# Patient Record
Sex: Female | Born: 1965 | Race: White | Hispanic: Yes | Smoking: Never smoker
Health system: Western US, Academic
[De-identification: ages and names within clinical notes are randomized; demographics above are authoritative.]

## PROBLEM LIST (undated history)

## (undated) DIAGNOSIS — D219 Benign neoplasm of connective and other soft tissue, unspecified: Secondary | ICD-10-CM

## (undated) DIAGNOSIS — I10 Essential (primary) hypertension: Secondary | ICD-10-CM

## (undated) DIAGNOSIS — K922 Gastrointestinal hemorrhage, unspecified: Secondary | ICD-10-CM

## (undated) DIAGNOSIS — D649 Anemia, unspecified: Secondary | ICD-10-CM

## (undated) DIAGNOSIS — K2970 Gastritis, unspecified, without bleeding: Secondary | ICD-10-CM

## (undated) DIAGNOSIS — N289 Disorder of kidney and ureter, unspecified: Secondary | ICD-10-CM

## (undated) DIAGNOSIS — I2510 Atherosclerotic heart disease of native coronary artery without angina pectoris: Secondary | ICD-10-CM

## (undated) DIAGNOSIS — E039 Hypothyroidism, unspecified: Secondary | ICD-10-CM

## (undated) HISTORY — DX: Essential (primary) hypertension: I10

## (undated) HISTORY — PX: ENDOMETRIAL ABLATION: SHX621

## (undated) HISTORY — PX: TUBAL LIGATION: SHX77

## (undated) HISTORY — DX: Hypothyroidism, unspecified: E03.9

## (undated) HISTORY — DX: Gastrointestinal hemorrhage, unspecified: K92.2

## (undated) HISTORY — DX: Gastritis, unspecified, without bleeding: K29.70

## (undated) HISTORY — DX: Disorder of kidney and ureter, unspecified: N28.9

## (undated) HISTORY — DX: Benign neoplasm of connective and other soft tissue, unspecified: D21.9

## (undated) HISTORY — DX: Atherosclerotic heart disease of native coronary artery without angina pectoris: I25.10

## (undated) HISTORY — DX: Anemia, unspecified: D64.9

## (undated) MED ORDER — SODIUM CHLORIDE 0.9 % IV SOLN
500.0000 mL | INTRAVENOUS | Status: AC
Start: 2020-04-19 — End: ?

## (undated) MED ORDER — CEFAZOLIN SODIUM-DEXTROSE 2-4 GM/100ML-% IV SOLN
2000.0000 mg | Freq: Once | INTRAVENOUS | Status: AC
Start: 2020-04-25 — End: 2020-04-25

## (undated) MED ORDER — ENOXAPARIN SODIUM 100 MG/ML SC SOLN
40.0000 mg | Freq: Once | SUBCUTANEOUS | Status: AC
Start: 2020-03-27 — End: 2020-03-27

## (undated) MED ORDER — ENOXAPARIN SODIUM 40 MG/0.4ML SC SOLN
SUBCUTANEOUS | Status: AC
Start: 2020-05-14 — End: ?

## (undated) MED ORDER — ENOXAPARIN SODIUM 40 MG/0.4ML SC SOLN
SUBCUTANEOUS | Status: AC
Start: 2020-05-15 — End: ?

## (undated) MED ORDER — ACETAMINOPHEN 500 MG OR TABS
1000.0000 mg | ORAL_TABLET | Freq: Once | ORAL | Status: AC
Start: 2020-04-19 — End: 2020-04-19

---

## 2019-02-04 ENCOUNTER — Telehealth: Payer: Self-pay

## 2019-02-04 NOTE — Telephone Encounter (Signed)
Spoke to patient about IEHP and [facility] No Contract therefore we need an LOA in Sharepoint to continue visit. I called pt again and left her a voice message with description of LOA and directing [place] requested to continue appointment as Self Pay, but will follow up with [facility] for LOA Letterfor future appointments.

## 2019-02-07 ENCOUNTER — Encounter: Payer: Self-pay | Admitting: Ob/Gyn

## 2019-02-07 ENCOUNTER — Ambulatory Visit: Payer: Self-pay | Attending: Ob/Gyn | Admitting: Ob/Gyn

## 2019-02-07 VITALS — BP 136/73 | HR 81 | Temp 97.9°F | Resp 18 | Ht 62.99 in | Wt 273.7 lb

## 2019-02-07 DIAGNOSIS — N898 Other specified noninflammatory disorders of vagina: Secondary | ICD-10-CM | POA: Insufficient documentation

## 2019-02-07 DIAGNOSIS — Z6841 Body Mass Index (BMI) 40.0 and over, adult: Secondary | ICD-10-CM | POA: Insufficient documentation

## 2019-02-07 DIAGNOSIS — R300 Dysuria: Secondary | ICD-10-CM | POA: Insufficient documentation

## 2019-02-07 DIAGNOSIS — N939 Abnormal uterine and vaginal bleeding, unspecified: Secondary | ICD-10-CM | POA: Insufficient documentation

## 2019-02-07 LAB — URINALYSIS 8, POINT OF CARE TESTING
Bld, UA POCT: NEGATIVE
Glucose, UA POCT: NEGATIVE mg/dL
Ketone, UA POCT: NEGATIVE mg/dL
Nitrite, UA POCT: NEGATIVE
Protein, UA POCT: NEGATIVE mg/dL
Specific Gravity, UA (POCT): 1.025 (ref 1.003–1.030)
pH, UA POCT: 6.5 (ref 5.0–8.0)

## 2019-02-07 LAB — TRICHOMONAS EXAM: Direct Exam: NEGATIVE

## 2019-02-07 LAB — HEMOGLOBIN, HEMOCUE POINT OF CARE TESTING: Hem, Hemocue POC: 11.9 G/DL (ref 11.5–15.0)

## 2019-02-07 MED ORDER — MEDROXYPROGESTERONE ACETATE 10 MG OR TABS
10.0000 mg | ORAL_TABLET | Freq: Every day | ORAL | 11 refills | Status: DC
Start: 2019-02-07 — End: 2019-04-14

## 2019-02-07 MED ORDER — DOXYCYCLINE MONOHYDRATE 100 MG OR CAPS
100.0000 mg | ORAL_CAPSULE | Freq: Two times a day (BID) | ORAL | 0 refills | Status: AC
Start: 2019-02-07 — End: 2019-02-21

## 2019-02-07 NOTE — Progress Notes (Signed)
53 year old [id number] with PMH of bmi of 13, HTN, fibroids, cardiovascular disease, hiatal hernia, c-section presenting for AUB heavy menstrual bleeding, menses always heavy and long. Then 11 years ago started going 8-10 days.  S/p hysteroscopy and endometrial ablation x2 (we do not know if she ever had an EMBx) and last ablation probably May and still heavy bleeding. Reports dizziness.   BP 136/73 (BP Location: Left arm, BP Patient Position: Sitting, BP cuff size: Regular)   Pulse 81   Temp 97.9 F (36.6 C) (Temporal)   Resp 18   Ht 5' 2.99" (1.6 m)   Wt 124.2 kg (273 lb 11.2 oz)   BMI 48.50 kg/m   Pap uptodate  Get all records from [facility]- especially path  Pain on exam- course of doxycyclin 100 mg BID  Hemoque 11.9  Rec TVUS   Start trial provera 10 daily until menses and increase to BID during menses will give enough refills for 11 months  Recommend f/u with PCP for possible surgical clearance   Given cardiac history will recommend LNG IUD initially     Attending Attestation  I saw and examined the patient and discussed the case with the resident/fellow. I agree with the final findings and plan as documented in the record. We formulated the assessment and plan together. Any additions or revisions are included in the record.     Tiburcio Bash MD    02/07/19  3:51 PM

## 2019-02-07 NOTE — Progress Notes (Signed)
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    The patient reports a long-standing history of heavy, long periods. From menarche until 2009, she had regular periods with 8 days of bleeding, 6 of which were heavy. In 2009, her periods became longer, now lasting 10 days with heavy bleeding for 8 of those days. She reports using 3 superpads per hour during her heavy days. She endorses occasional dizziness and headaches, which she associates with her heart disease. She denies chest pain. She takes PO iron TID. She denies constipation.    Per records received, patient underwent an ablation with hysteroscopy in Nov/Dec 2019. Per patient her bleeding was worse after that second ablation. She is unsure of whether she has had an endometrial biopsy.     She has never been on hormonal treatment for her AUB. She has never had a discussion about LNG-IUD.    In terms of her h/o fibroids, she reports that she underwent a preterm C-section for her last pregnancy because of her fibroid. She reports that it has been a long time since she had a TVUS.    She has chronic abdominal pain due to her hiatal hernia. She complains of vaginal/vulvar burning and irritation. She denies dysuria, vaginal discharge, or pelvic pain.    She was referred from her Gyn provider--Michelle Oropeza PA--to [facility] for surgical management. However, she was unable to be seen at [facility], so she was sent to [place].    Obstetrical History:  OB History   Gravida Para Term Preterm AB Living   5 4 3 1 1 4    SAB TAB Ectopic Multiple Live Births   1       4      # Outcome Date GA Lbr Len/2nd Weight Sex Delivery Anes PTL Lv    5 Preterm  [redacted]w[redacted]d    CS-LTranv   LIV   4 Term  [redacted]w[redacted]d       LIV   3 Term  [redacted]w[redacted]d       LIV   2 Term  [redacted]w[redacted]d       LIV   1 SAB               Obstetric Comments   G1: SAB   G2-G4: NSVD   G5: preterm C-section due to fibroids        Gynecologic History:  Menarche 12  Periods occur monthly, but have always been heavy (3 pads/hr) and long (lasting 8 days)  Reports that periods have been lasting longer (8-10days) x11 years  Hysteroscopy and ablation in 2016, 2020  Bleeding is worse after second ablation  Last pap smear 2019 - normal  Denies h/o STI or STD    Past Medical History:  Past Medical History:   Diagnosis Date   . Anemia    . Congenital kidney disease    . Coronary artery disease    . Fibroids    . Gastritis    . GI bleed    . Hypertension    . Hypothyroidism         Past Surgical History:  Past Surgical History:   Procedure  Laterality Date   . CESAREAN SECTION, LOW TRANSVERSE     . ENDOMETRIAL ABLATION      x2   . TUBAL LIGATION      at time of C-section       Family History:   Family History   Problem Relation Name Age of Onset   . Hypertension Mother     . Osteoporosis Mother     . Stroke Mother     . No Known Problems Sister     . No Known Problems Brother         Social History:   Lives in [place], in an apartment, lives with 2 daughters and boyfriend. Feels safe.  Denies EtOH, tobacco, illicit drug use  Denies h/o assault or abuse    Medications:   Amlodipine-benazepril  Metoprolol  Levothyroxine  Iron  Tylenol for pain    Allergies:   No Known Allergies    ROS:   Per HPI. 14 point ROS otherwise negative.    Physical Exam:   BP 136/73 (BP Location: Left arm, BP Patient Position: Sitting, BP cuff size: Regular)   Pulse 81   Temp 97.9 F (36.6 C) (Temporal)   Resp 18   Ht 5' 2.99" (1.6 m)   Wt 124.2 kg (273 lb 11.2 oz)   BMI 48.50 kg/m     General: no acute distress  HEENT: NC/AT  Lungs: unlabored breathing  Abdomen: soft, obese, non-gravid, TTP in epigastric region, no rebound or  guarding.  Vaginal Exam:  Normal appearing external genitalia with redundant tissue.  Unable to appreciate uterus or adnexal masses 2/2 body habitus.  No vaginal wall lesions or cervical lesions. Normal appearing physiologic discharge + cervical motion tenderness, + left adnexal tenderness, - right adnexal tenderness  Extremities: no clubbing, cyanosis, edema    Labs:  Hemocue 11.9      Assessment/Plan:  Brandi Hart is a 53 year old [phone] with a complex medical history including CAD, fibroids, HTN, morbid obesity, s/p 2 ablations for AUB presenting for management of her AUB.    # AUB  - records requested today: Pap, prior pathology (EMB or D&C), ultrasounds  - patient with symptoms of anemia, but I am reassured by Hemocue today of 11.9  - patient desires hysterectomy but we discussed today that she is higher risk and that conservative management with an LNG-IUD may be a safer option for her  - TVUS ordered today to assess for structural lesions as well as for uterine size   - rx provera 10 daily until menses and increase to BID during menses   - patient encouraged to make appointment with her PCP regarding medical optimization for surgery    # concern for pelvic inflammatory disease  - + cervical motion tenderness, + left adnexal tenderness  - swabs sent for GC/CT  - rx doxycycline 100mg  BID x14d sent to pharmacy    # vaginal/vulvar irritation and burning  - UDip unremarkable  - NEFG with normal physiologic discharge  - swabs for GC/CT and vaginitis sent, will f/u results    Insurance coverage will need to be coordinated with [place]. Patient is instructed to call Rosa once her ultrasound has been completed. We can then pursue insurance approval (which will be good for 1 year), and schedule patient for return to clinic. At that time, her records including ultrasound, can be reviewed and LNG-IUD can be discussed further.    The patient understands the above assessment and plan  as outlined above. All questions were  answered at bedside with the patient.  Patient discussed with Dr. Jerry Caras (A).     Briant Cedar MD, MPH  Obstetrics & Gynecology, PGY-2    02/07/19, 2:50 PM

## 2019-02-08 ENCOUNTER — Telehealth: Payer: Self-pay

## 2019-02-08 NOTE — Telephone Encounter (Signed)
Re faxed medical records to [phone]) Louie Boston, LVN

## 2019-02-08 NOTE — Telephone Encounter (Signed)
Faxed auth to obtain medical records from [place] [phone]) Louie Boston, [place]

## 2019-02-08 NOTE — Progress Notes (Signed)
89 year [id number] with PMH of bmi of 48, HTN, fibroids, cardiovascular disease, hiatal hernia, c-section presenting for AUB heavy menstrual bleeding, menses always heavy and long. Then 11 years ago started going 8-10 days.  S/p hysteroscopy and endometrial ablation x2 (we do not know if she ever had an EMBx) and last ablation probably May and still heavy bleeding. Reports dizziness.   BP 136/73 (BP Location: Left arm, BP Patient Position: Sitting, BP cuff size: Regular)   Pulse 81   Temp 97.9 F (36.6 C) (Temporal)   Resp 18   Ht 5' 2.99" (1.6 m)   Wt 124.2 kg (273 lb 11.2 oz)   BMI 48.50 kg/m         Pap uptodate  Get all records from [facility]- especially path  Pain on exam- course of doxycyclin 100 mg BID  Hemoque 11.9  Rec TVUS   Start trial provera 10 daily until menses and increase to BID during menses will give enough refills for 11 months  Recommend f/u with PCP for possible surgical clearance   Given cardiac history will recommend LNG IUD initially     Attending Attestation  I saw and examined the patient and discussed the case with the resident/fellow. I agree with the final findings and plan as documented in the record. We formulated the assessment and plan together. Any additions or revisions are included in the record.     Tiburcio Bash MD    02/07/19  3:51 PM

## 2019-02-10 ENCOUNTER — Telehealth: Payer: Self-pay | Admitting: Ob/Gyn

## 2019-02-10 LAB — GENITAL CULTURE

## 2019-02-10 MED ORDER — MICONAZOLE NITRATE APPLICATOR 100 & 2 MG-% (9GM) VA KIT
1.0000 | PACK | Freq: Every day | VAGINAL | 0 refills | Status: AC
Start: 2019-02-10 — End: 2019-02-17

## 2019-02-10 NOTE — Telephone Encounter (Signed)
Phoned patient to inform of yeast infection, monistat sent to pharmacy, patient amenable to treatment plan     Brandi Hart A. Arvin Collard, MD  OBGYN PGY-3

## 2019-02-14 LAB — C. TRACHOMATIS + N. GONORRHOEAE
Nucleic Acid Ct: NEGATIVE
Nucleic Acid Ng: NEGATIVE

## 2019-02-15 ENCOUNTER — Encounter: Payer: Self-pay | Admitting: Ob/Gyn

## 2019-02-24 ENCOUNTER — Telehealth: Payer: Self-pay

## 2019-02-24 NOTE — Telephone Encounter (Signed)
Medical records received from [facility] on 02/09/2019, a total of 9 pgs. Records put in chiefs box ready for review.  Brandi Hart

## 2019-02-26 ENCOUNTER — Ambulatory Visit
Admission: RE | Admit: 2019-02-26 | Discharge: 2019-02-26 | Disposition: A | Discharge status: Home / Self Care | Payer: Medicaid Other | Attending: Ob/Gyn | Admitting: Ob/Gyn

## 2019-02-26 DIAGNOSIS — N939 Abnormal uterine and vaginal bleeding, unspecified: Secondary | ICD-10-CM | POA: Insufficient documentation

## 2019-02-26 DIAGNOSIS — N858 Other specified noninflammatory disorders of uterus: Secondary | ICD-10-CM | POA: Insufficient documentation

## 2019-02-28 ENCOUNTER — Encounter: Payer: Self-pay | Admitting: Student in an Organized Health Care Education/Training Program

## 2019-02-28 NOTE — Progress Notes (Signed)
2019 Pap results and 2018 H. Pylor results received:      Pap w/ HPV mRNA  Collected 01/26/2018  Results: Pap: Negative (NILM)  HPV: Negative    H. Pylori: Upper GI Biopsy, Stomach, Antrum  Collected: 09/10/2016  Diagnosis: Gastric mucosa with no diagnostic abnormalities. Negative for H. Pylori.      Medical Student  Varney Biles, MS4

## 2019-02-28 NOTE — Progress Notes (Signed)
Message sent to [facility] to try to get insurance for this patient for further tx of AUB

## 2019-03-07 ENCOUNTER — Encounter: Payer: Self-pay | Admitting: Student in an Organized Health Care Education/Training Program

## 2019-03-07 DIAGNOSIS — N939 Abnormal uterine and vaginal bleeding, unspecified: Secondary | ICD-10-CM

## 2019-03-07 NOTE — Progress Notes (Signed)
Per [place], patient has IEHP eligibility and she is unable to see [place] in our clinic. [facility] [place]. Patient already aware.    Bradd Canary, MD  [facility], PGY-3    For questions or concerns, please call the [facility] Resident 1 on [facility]

## 2019-03-21 ENCOUNTER — Telehealth: Payer: Self-pay | Admitting: Ob/Gyn

## 2019-03-21 NOTE — Telephone Encounter (Signed)
Message routed to Rosa M. [facility]

## 2019-03-21 NOTE — Telephone Encounter (Signed)
Patient states she is ready to move forward with scheduling her surgery, states she has been waiting to schedule it since before transfrering care to [facility]. Please call pt to further assist.

## 2019-03-29 NOTE — Telephone Encounter (Signed)
Will follow up with patient and advise LOA is still Pending As of Now. I will follow up with Contracting coordinator for [place] but for now unable to continue at [facility] until further notice.    Thank you

## 2019-04-01 ENCOUNTER — Telehealth: Payer: Self-pay | Admitting: Ob/Gyn

## 2019-04-01 NOTE — Telephone Encounter (Signed)
Clarified to [name] coordinator As Of Now IEHP LOA request is still Pending Approval, and Cannot schedule without an LOA until further notice at [facility]. Confirmed conversation and Will Continue appt as [facility].  Thank you

## 2019-04-01 NOTE — Telephone Encounter (Signed)
Spoke to Brass Castle at [facility] who states there is an authorization already approved for Obgyn she is going to fax it to [phone]. Roddie Mc that appointment on 04/14/19 with Dr Posey Pronto is the soonest available at this time.

## 2019-04-01 NOTE — Telephone Encounter (Signed)
Brandi Hart with Dr. Marquis Lunch office is requesting an urgent appt for ER follow up. pls see referral#[id number]-this is valid for use at [facility] office, but not for [facility] without LOA.

## 2019-04-14 ENCOUNTER — Encounter: Payer: Self-pay | Admitting: Ob/Gyn

## 2019-04-14 ENCOUNTER — Ambulatory Visit (INDEPENDENT_AMBULATORY_CARE_PROVIDER_SITE_OTHER): Payer: Medicaid Other | Admitting: Ob/Gyn

## 2019-04-14 ENCOUNTER — Telehealth: Payer: Self-pay | Admitting: Ob/Gyn

## 2019-04-14 VITALS — BP 116/72 | HR 69 | Ht 63.0 in | Wt 272.3 lb

## 2019-04-14 DIAGNOSIS — N939 Abnormal uterine and vaginal bleeding, unspecified: Secondary | ICD-10-CM

## 2019-04-14 MED ORDER — VITAMIN D3 25 MCG (1000 UT) PO CAPS
1.00 | ORAL_CAPSULE | Freq: Every day | ORAL | Status: AC
Start: 2019-02-23 — End: ?

## 2019-04-14 MED ORDER — FAMOTIDINE 20 MG OR TABS
ORAL_TABLET | ORAL | Status: DC
Start: 2019-02-10 — End: 2020-04-26

## 2019-04-14 MED ORDER — METOPROLOL TARTRATE 25 MG OR TABS
25.00 mg | ORAL_TABLET | Freq: Two times a day (BID) | ORAL | Status: AC
Start: 2019-02-04 — End: ?

## 2019-04-14 MED ORDER — ALBUTEROL SULFATE 108 (90 BASE) MCG/ACT IN AERS
INHALATION_SPRAY | RESPIRATORY_TRACT | Status: AC
Start: 2019-03-23 — End: ?

## 2019-04-14 MED ORDER — AMLODIPINE-BENAZEPRIL 10-40 MG OR CAPS
ORAL_CAPSULE | ORAL | Status: AC
Start: 2019-02-20 — End: ?

## 2019-04-14 MED ORDER — FLOVENT HFA 110 MCG/ACT IN AERO
INHALATION_SPRAY | RESPIRATORY_TRACT | Status: AC
Start: 2019-03-15 — End: ?

## 2019-04-14 MED ORDER — FUROSEMIDE 40 MG OR TABS
ORAL_TABLET | ORAL | Status: AC
Start: 2019-02-04 — End: ?

## 2019-04-14 MED ORDER — PANTOPRAZOLE SODIUM 40 MG OR TBEC
DELAYED_RELEASE_TABLET | ORAL | Status: AC
Start: 2019-03-18 — End: ?

## 2019-04-14 MED ORDER — ACETAMINOPHEN-CODEINE #3 300-30 MG OR TABS
ORAL_TABLET | ORAL | Status: DC
Start: 2019-03-28 — End: 2020-04-26

## 2019-04-14 MED ORDER — MEDROXYPROGESTERONE ACETATE 150 MG/ML IM SUSY
150.0000 mg | PREFILLED_SYRINGE | Freq: Once | INTRAMUSCULAR | Status: DC
Start: 2019-04-14 — End: 2019-04-14

## 2019-04-14 NOTE — H&P (Signed)
New Gynecologic Patient: Brandi Hart    Assessment/Plan  53 year old [id number] with AUB-heavy likely due to leiomyoma presents requesting surgical intervention. Patient also desires pain management for pelvic pain, though declines exam. We reviewed the findings from her previous ultrasound including the size of her uterus (11cm), the 5cm fibroid at the fundus, and 3cm fibroid in the uterine body. We discussed that definitive surgical intervention would require a hysterectomy and a minimally invasive approach would be the safest option given her medical history and obesity. She would need to be able to tolerate Trendelenberg position and we would need medical and cardiac clearance for surgery. She reports having clearance but no records available. She was asked to sign a records release and refused. She was counseled on the risks of surgery including bleeding, infection, injury to other organs, DVT/PE, and death. She was advised of the need for a 6 week recovery period from surgery. She was also informed that while her bleeding would improve, her pelvic pain may not resolve if it is not gynecologic in origin. She was offered medical interventions for her heavy bleeding and dysmenorrhea as a primary treatment given menopause is near or as a bridge to surgery. She was offered and recommended a LNG IUD to be inserted with Endometrial biopsy to rule out endometrial cancer as a cause for her HMB. I offered to do the procedures in the office today but patient declined desiring general anesthesia. I offered local anesthesia but she declined. I advised her that she would need surgical clearance for GA as well for IUD insertion. I offered her oral Provera which she declined. I also offered her DMPA which she initially accepted but then declined with my MA. I explained that these hormonal treatments would also help with her dysmenorrhea and her pelvic pain if it was gynecologic in origin. I encouraged her to investigate other causes  of her pelvic pain, since mass effect is unlikely given size of her uterus. We discussed how after menopause fibroids usually shrink since they are hormonally driven and that she needs treatment until then as her anemia is related to her HMB. We also discussed uterine artery embolization, and pain associated with fibroid degeneration as well as regrowth of vessels. I offered her a follow up visit after we have records for clearance and endometrial biopsy to plan her surgery. I offered her a CBC today to see if we should offer preop blood transfusion or IV iron. She declined blood draw with my MA. My MA advised her that she may return as she wishes to further manage her AUB.    Greater than 50% of this 30 minute visit was spent in counseling and coordination of care.    Lina Sayre, MD MSc  OB/GYN Attending  Division of Family Planning  ?    Subjective:  Chief Complaint: Heavy menstrual bleeding due to likely leiomyoma    53 year old [id number] who presents with abnormal uterine bleeding requesting surgery. She reports a history of Brandi Hart due to leiomyoma. Reports a history of endometrial ablation x 2, possibly last in May (no records available for review). She is unsure if endometrial biopsy was ever performed to rule out endometrial carcinoma. Also reports dysmenorrhea. Denies intermenstrual bleeding. Reports pelvic pain even without menses. Denies dysuria, constipation, or diarrhea.    Prior evaluation: Seen at [facility] 02/07/2019 for AUB. [place] ordered and patient given Provera to take daily and BID during menses. Asked patient for medical records indicating PCP surgical and  cardiac clearance for hysterectomy.    Patient reports not taking Provera after her last visit. She then reports LMP 03/27/19 that was so severe with blood loss that she went to [facility] ER. She was offered a blood transfusion and declined. She does not wish to disclose why but has no religious objection. She does not know her hemoglobin at the  time. She now reports having orthostatic LH and occasional dizziness with fatigue.    Menstrual Quality: very heavy, passing clots  Periods are regular q 28-30 days, lasting 8-10 days.  Dysmenorrhea:severe, occurring throughout cycle.   Current contraception: tubal ligation  Last PAP was done: 01/2018 NILM, HPV neg    Past Medical History  Past Medical History:   Diagnosis Date   . Anemia    . Congenital kidney disease    . Coronary artery disease    . Fibroids    . Gastritis    . GI bleed    . Hypertension    . Hypothyroidism      Past Surgical History  Past Surgical History:   Procedure Laterality Date   . CESAREAN SECTION, LOW TRANSVERSE     . ENDOMETRIAL ABLATION      x2   . TUBAL LIGATION      at time of C-section     Medication    Current Outpatient Medications:   .  acetaminophen-codeine (TYLENOL #3) 300-30 MG tablet, TK 1/2 - 1 T PO Q 6 H PRN P, Disp: , Rfl:   .  albuterol 108 (90 Base) MCG/ACT inhaler, INHALE 2 PUFFS PO Q 4 H FOR 90 DAYS, Disp: , Rfl:   .  amlodipine-benazepril (LOTREL) 10-40 MG capsule, TK ONE C PO QD, Disp: , Rfl:   .  famotidine (PEPCID) 20 MG tablet, TK 1 T PO QHS PRN, Disp: , Rfl:   .  FLOVENT HFA 110 MCG/ACT inhaler, INL 2 PFS PO BID, Disp: , Rfl:   .  furosemide (LASIX) 40 MG tablet, TK 1 T PO  D PRN, Disp: , Rfl:   .  metoprolol tartrate (LOPRESSOR) 25 MG tablet, Take 25 mg by mouth 2 times daily., Disp: , Rfl:   .  pantoprazole (PROTONIX) 40 MG tablet, TK 1 T PO DAILY, Disp: , Rfl:   .  VITAMIN D3 25 MCG (1000 UT) capsule, TK 1 C PO BID, Disp: , Rfl:     Allergy  No Known Allergies  ?  Social History  Social History     Tobacco Use   . Smoking status: Never Smoker   . Smokeless tobacco: Never Used   Substance Use Topics   . Alcohol use: Never     Frequency: Never   . Drug use: Never     Family History  Family History   Problem Relation Name Age of Onset   . Hypertension Mother     . Osteoporosis Mother     . Stroke Mother     . No Known Problems Sister     . No Known Problems  Brother       ?  Review of Systems  10 point ROS negative except for HPI    Objective:  Physical Exam  BP 116/72 (BP Location: Left arm, BP Patient Position: Sitting, BP cuff size: Large)   Pulse 69   Ht 5\' 3"  (1.6 m)   Wt 123.5 kg (272 lb 4.3 oz)   LMP 03/27/2019   Breastfeeding No   BMI 48.23 kg/m  PE   04/14/19  1318   BP: 116/72   Pulse: 69      General alert; well nourished; obese   Head/Ears/Nose/Throat normocephalic atraumatic   Eyes pupils reactive to light   Neck supple; full range of motion  -Lung: normal work of breathing   Genitourinary/Gynecology: Declined   Neurological alert and oriented; cranial nerves grossly intact; normal gait  ??  ?  Pertinent Labs/Radiologic images reviewed:  Transvaginal Pelvic Ultrasound    Date: 02/26/2019 10:22 AM    Clinical History: 53 yo with fibroids and heavy vaginal bleeding; Abnormal uterine and vaginal bleeding, unspecified    Comparison: None    Images submitted: 45 still images and 2 cine loops    Findings:    Sonographic imaging of the pelvis was performed via transvaginal approach.  The examination was limited due to patient body habitus, overlying bowel gas, and patient discomfort.      The uterus measures 11.0 x 5.4 x 7.0 cm and demonstrates diffusely heterogeneous echotexture.  *  There is an ill-defined 4.3 x 2.9 x 4.8 cm isoechoic mass in the fundus.  *  There is an ill-defined 2.7 x 1.9 x 2.1 cm hypoechoic mass in the uterine body.    There are prominent nabothian cysts which measure up to about 2.8 cm.    The endometrial stripe measures 0.7 cm in thickness.    Neither ovary is visualized.    No free fluid is identified.    IMPRESSION:    Heterogeneous myometrial echotexture with ill-defined masses which measure up to about 4.8 cm. These masses may represent fibroids or focal adenomyomas.

## 2019-04-14 NOTE — Telephone Encounter (Signed)
IVETTE TOOK CALL

## 2019-04-14 NOTE — Telephone Encounter (Signed)
Patient's primary care doctor office calling requesting notes from today's visit, states the patient called their office stating she is in a lot of pain.  Connected to [facility]   fax # [phone]

## 2019-04-28 ENCOUNTER — Telehealth: Payer: Self-pay | Admitting: Ob/Gyn

## 2019-04-28 NOTE — Telephone Encounter (Signed)
Patient calling to schedule for injection. Please contact. Thank you.

## 2019-04-29 NOTE — Telephone Encounter (Signed)
Spoke with patient let her know Josem Kaufmann is still pending. We will call her to follow up and schedule. Patient verbally understood

## 2019-05-10 ENCOUNTER — Ambulatory Visit: Payer: Medicaid Other | Attending: Surgery | Admitting: Surgery

## 2019-05-10 ENCOUNTER — Encounter: Payer: Self-pay | Admitting: Surgery

## 2019-05-10 VITALS — BP 103/62 | HR 71 | Temp 98.1°F | Resp 16 | Ht 63.0 in | Wt 267.9 lb

## 2019-05-10 DIAGNOSIS — K449 Diaphragmatic hernia without obstruction or gangrene: Secondary | ICD-10-CM | POA: Insufficient documentation

## 2019-05-10 DIAGNOSIS — K2100 Gastro-esophageal reflux disease with esophagitis, without bleeding: Secondary | ICD-10-CM | POA: Insufficient documentation

## 2019-05-10 MED ORDER — ATORVASTATIN CALCIUM 40 MG OR TABS
ORAL_TABLET | ORAL | Status: AC
Start: 2019-04-19 — End: ?

## 2019-05-10 MED ORDER — LEVOTHYROXINE SODIUM 125 MCG OR TABS: 125.00 ug | ORAL_TABLET | Freq: Every day | ORAL | Status: AC

## 2019-05-10 MED ORDER — VENLAFAXINE HCL 37.5 MG OR CP24
37.50 mg | ORAL_CAPSULE | Freq: Every day | ORAL | Status: AC
Start: 2019-04-29 — End: ?

## 2019-05-10 MED ORDER — ONDANSETRON 4 MG OR TBDP
4.00 mg | ORAL_TABLET | Freq: Four times a day (QID) | ORAL | Status: AC | PRN
Start: 2019-04-25 — End: ?

## 2019-05-10 MED ORDER — HYDROCODONE-ACETAMINOPHEN 5-325 MG OR TABS
1.00 | ORAL_TABLET | Freq: Four times a day (QID) | ORAL | Status: AC | PRN
Start: 2019-05-03 — End: ?

## 2019-05-10 NOTE — Progress Notes (Addendum)
Patient attended bariatric EMMI Videos: PENDING. COMPLETED PER PATIENT   First consult was on 05-10-19.  UGI: 02-17-20  [facility]  Psych consult: 01-16-20  In [facility]   Sleep Study: 11-10-19    Medical Records: [facility] Weight Loss Management: 6 MONTHS WITH VARSHA  05-10-19  06-14-19  07-12-19  08-10-19  09-07-19  10-11-19  11-15-19    EGD with Bravo: EGD Done and In Media, no Bravo was done    Patient will be having Bypass with Hiatal Hernia Repair

## 2019-05-10 NOTE — Progress Notes (Signed)
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    Recent CT Abdomen (04/21/2019) showed a large hiatal hernia.      The patient was initially diagnosed with a PEH 8 years ago during a workup forfullness after meals, belching and eructation, abdominal bloating, heartburn, bilious reflux, upper abdominal discomfort, symptoms primarily relate to meals, and lying down after meals, chest pain. The patient's  primary symptoms is  epigastric pain. The patient has experienced this symptom for 5 years. This paraesophageal hernia is also associated with regurgitation, heartburn and epigastric pain. Pt also complains of dysphagia and chest pain occasionally. The patient is tolerating a  regular diet but only eats a little, and has not experienced unintentional weight loss. 293 lbs was highest adult weight and 127 was lowest adult weight. The most weight she has lost was 40 lbs with a weight loss medication from [place].      Relevant workup to date is summarized below:    04/21/2019: CT Abdomen w/ and w/o Contrast [facility])  FINDINGS:   CT abdomen: Lung bases clear no pleural or pericardial fluid. Large hiatal hernia is identified.  Liver gallbladder biliary tree pancreas and spleen are normal. No adrenal masses. 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 No free fluid or free air. There is no  retroperitoneal or mesenteric lymphadenopathy. There is calcification the abdominal aorta without aneurysm.  Small umbilical hernia contains fat. Mild degenerative changes lumbar spine. There is diverticulosis of the  proximal sigmoid colon without diverticulitis    IMPRESSION:   Large hiatal hernia  Horseshoe type kidney    04/11/2011: Upper GI [facility])  FINDINGS:   Fluoroscopic and film examination demonstrate no obstructions within the esophagus. There is  approximately 5 cm hiatus hernia of the stomach associated with severe gastroesophageal reflux. The stomach  otherwise, fills normally with contrast and it is free from ulcerations, filling defects, irregularities or abnormal  extrinsic compression. The gastric rugal folds are in the normal range and normal gastric peristaltic activity.  The duodenal bulb, loop, and the visualized proximal small bowel are within normal limits.    IMPRESSION:  1. Approximately 5 cm sliding hiatus hernia of the stomach associated with severe gastroesophageal reflux.  2. No demonstrable ulcer crater, obstruction or other abnormality on the current study.     Past Medical History:   Past Medical History:   Diagnosis Date   . Anemia    . Congenital kidney disease    . Coronary artery disease    . Fibroids    . Gastritis    . GI bleed    . Hypertension    . Hypothyroidism    - OSA     Past Surgical History:   Past Surgical History:   Procedure Laterality Date   . CESAREAN SECTION, LOW TRANSVERSE     . ENDOMETRIAL ABLATION      x2   . TUBAL LIGATION      at time of C-section      Family and Social History:  Family History   Problem Relation Name Age of Onset   . Hypertension Mother     . Osteoporosis Mother     .  Stroke Mother     . No Known Problems Sister     . No Known Problems Brother       Social History     Tobacco Use   . Smoking status: Never Smoker   . Smokeless tobacco: Never Used   Substance Use Topics   . Alcohol use: Never     Frequency: Never     Active Meds:   Current Outpatient Medications   Medication Sig   . acetaminophen-codeine (TYLENOL #3) 300-30 MG tablet TK 1/2 - 1 T PO Q 6 H PRN P   . albuterol 108 (90 Base) MCG/ACT inhaler INHALE 2 PUFFS PO Q 4 H FOR 90 DAYS   . amlodipine-benazepril (LOTREL) 10-40 MG capsule TK ONE C PO QD   . atorvastatin (LIPITOR) 40 MG tablet TK 1 T PO QD   . famotidine (PEPCID)  20 MG tablet TK 1 T PO QHS PRN   . FLOVENT HFA 110 MCG/ACT inhaler INL 2 PFS PO BID   . furosemide (LASIX) 40 MG tablet TK 1 T PO  D PRN   . HYDROcodone-acetaminophen (NORCO) 5-325 MG tablet TK 1 T PO Q 6 H PRN SEVERE PAIN   . levothyroxine (SYNTHROID) 125 MCG tablet Take 125 mcg by mouth every morning (before breakfast).   . metoprolol tartrate (LOPRESSOR) 25 MG tablet Take 25 mg by mouth 2 times daily.   . ondansetron (ZOFRAN ODT) 4 MG disintegrating tablet DIS 1 T ON THE TONGUE Q 6 H PRF NAUSEA OR VOM   . pantoprazole (PROTONIX) 40 MG tablet TK 1 T PO DAILY   . venlafaxine (EFFEXOR XR) 37.5 MG XR capsule TK 1 C PO QD WF   . VITAMIN D3 25 MCG (1000 UT) capsule TK 1 C PO BID     No current facility-administered medications for this visit.      Allergies:   No Known Allergies   REVIEW OF SYSTEMS:  A complete review of systems was conducted and negative except the pertinent positives in the HPI and the following positives:     Physical Exam:   BP 103/62 (BP Location: Left arm, BP Patient Position: Sitting, BP cuff size: Large)   Pulse 71   Temp 98.1 F (36.7 C) (Temporal)   Resp 16   Ht 5\' 3"  (1.6 m)   Wt 121.5 kg (267 lb 13.7 oz)   BMI 47.45 kg/m   Constitutional:             well developed, well nourished, in no apparent distress  Eyes:                           PERLA  Head:                          normocephalic, atraumatic  Mouth/Throat:             anicteric  Chest:                         clear bilaterally  Cardiac:                      normal rate, regular rhythm, no murmurs, rubs, clicks or gallops  Vascular:                     no edema  Abdomen:  moderate tenderness in the in the epigastrium and in the LUQ.  Extremities:                 normal strength, tone, and muscle mass  Neurologic:                 normal without focal findings   Skin:                            no rashes or significant lesions      Assessment:    Brandi Hart is a 53 year old female with a large  paraesophageal hernia and obesity who in which if the hernia is repaired without weight loss she would be at high risk for failure.  In my medical opinion she would benefit more from repair fo the hiatal hernia and gastric bypass which would help with both weight loss and GERD suppression.      Plan:  1.  We explained the basic pathophysiology of paraesophageal hernia and the rationale for operative management with a laparoscopic paraesophageal hernia repair. We discussed the likelihood of improvement in the patient's symptoms after paraesophageal hernia repair but due to her wait she would be at a higher risk of failure.  I think the patient will benefit from  laparoscopic paraesophageal hernia repair and a roux-en-y gastric bypass surgery for weight loss vs a sleeve gastrectomy due to symptoms of GERD.         2.  Additional evaluation that needs completed prior to operation include the following:   - EM and upper GI  - bariatrics, dietician, psych eval, medical weight loss,   - f/u sleep study    Attending Attestation:   I evaluated and examined the patient and I agree with medical student, Soliman's note as edited by me to represent my evaluation, exam, and decision making.

## 2019-05-10 NOTE — Progress Notes (Signed)
VISIT TYPE: Bariatric Consult/Weight Mgt #1    This is a 53 year old female who presents to clinic today for evaluation for bariatric surgery. RD was consulted for appropriateness for surgery.   Pt reports has struggled with weight since most of her life, with highest weight at 300lbs.   Pt reports trying commercial diets, low carb diets for weight loss. Pt goal weight is 150lbs.   Currently, Pt is following a low carb diet. Pt's daughter had the sleeve one year ago a OSH. She is doing well and plans on supporting her mother.  NUTRITION ASSESSMENT  Anthropometrics:  Height: 5'3"  Weight: 267lbs  Date Weight Recorded 05/10/2019 04/14/2019 02/07/2019   Metric 121.5 kg 123.5 kg 124.15 kg   Pounds/Ounces 267 lb 13.7 oz 272 lb 4.3 oz 273 lb 11.2 oz       BMI: Estimated body mass index is 47.45 kg/m as calculated from the following:    Height as of an earlier encounter on 05/10/19: 5\' 3"  (1.6 m).    Weight as of an earlier encounter on 05/10/19: 121.5 kg (267 lb 13.7 oz).     Medical/Surgical History:  Past Medical History:   Diagnosis Date   . Anemia    . Congenital kidney disease    . Coronary artery disease    . Fibroids    . Gastritis    . GI bleed    . Hypertension    . Hypothyroidism      Past Surgical History:   Procedure Laterality Date   . CESAREAN SECTION, LOW TRANSVERSE     . ENDOMETRIAL ABLATION      x2   . TUBAL LIGATION      at time of C-section       Nutrition/Diet History:   Biggest challenge to healthy eating: potion sizes, food choices.   Eating out: occ.   Fluid intake: adequate; mostly waer  Food Allergies: nkfa  Emotional Eating: occ bored, stress    24 Hour Food Recall:  Breakfast: coffee only  Lunch:sometimes skips or will have fish and vegetables  Dinner: stew, chicken or fish with vegetables  Following a low carb diet with daughter    Nutrition Focused Physical Findings: Pt appears well nourished.   Difficulty Chewing or Swallowing: none  Current Exercise: none    Nutrition Education:  Person Taught:  Patient  Readiness to learn: Expresses interest and gaining insight on information from today's session.   Education Needed/Provided: Expectations following bariatric surgery.    Outcome/Evaluation: Verbalizes understanding and was able to demonstrate comprehension on verification.   Nutrition Education Method: RD provided education via verbal instructions and written instructions.   RD provided bariatric key points handout and discussed nutrition plan for weight loss. Pt demonstrates good understanding and show good motivation to comply.       Nutrition Counseling:  Recommend Plan:   1. Pt will start walking with daughter  2. Pt will practice mindful eating  3. 6 months medical weight loss    NUTRITION MONITORING  F/U at next Bariatric visit

## 2019-05-10 NOTE — Patient Instructions (Signed)
We have enrolled you into our bariatric program.    Today we went over the bariatric packet with you (blue folder), and the following will need to be done prior to the next appointment for your final evaluation for bariatric surgery:    1) Please do 6 months of medical weight management with    Nonnie Done, Nurse Practitioner Please call [phone] to make an appointment.     2)  Dietician consult today  3)  Social Worker Consult: will call   4)  Psych Consult - We will submit an order for you to been seen by Dr. Jiles Crocker @ [facility] [phone]).  If your insurance does not approve this visit, please contact a psychologist and have an evaluation using the guidelines provided to you today in the [facility].  The evaluation needs to be faxed to [place] at [phone] prior to the next appointment.  5)  Will be assigned EMMI videos to watch  6)  Some insurances require documentation from previous doctors of non-surgical attempts at weight loss.  3 years of medical records from outside medical provider is required.  Authorization to release medical information form to patient for process with PCP, signed. Please have your primary care doctor fax records to [phone].  7) Upper GI (if this is done at an outside facility, please have them upload images on a disk and bring to your next appointment along with the results.Please wait for [place] to process this through your insurance, once approved (takes 1-2 weeks), please call [phone] to schedule (if this is to be done at [facility]).  8) Sleep studies - We will submit an order to your insurance to have this done @ the [facility]  [phone]) If your insurance does not approve this to be done outside of [place], please make sure to fax the Report to [phone].  9) ESOPHAGEAL MANOMETRY. Please call [phone] to schedule. Please note that it takes 1-2 weeks to get authorization from your insurance.    If you have your scan done at an outside  facility, please obtain a disk of the scan when you are there and bring the disk with you to your follow-up appointment.  Have the facility fax the results to [phone], or pick up the results at a later date and bring with you to your follow-up appointment with [place]      [place].    When all of these are done please call [phone] to make a follow up appointment with our clinic.     Please call Suan Halter, RN [phone] if you have any questions or concerns. Please fax records or any applicable documents to [phone].    If you need to schedule or reschedule appointment:      [phone]    If you have not heard anything regarding your authorizations within 2 weeks, please call our  referral/ authorization coordinator    Surgery scheduling:  Dr. Eliberto Ivory, Dr. Duffy Bruce:   Langley Gauss [phone]   Fax: [phone]  Dr. Retta DionesCaren Griffins [phone]  Fax: [phone]    Authorizations:  Dr. Eliberto Ivory, Dr. Duffy BruceClaiborne Billings   [phone]     Fax: [phone]  Dr. Retta DionesCaren Griffins [phone]  Fax:  [phone]

## 2019-05-11 ENCOUNTER — Telehealth: Payer: Self-pay

## 2019-05-11 NOTE — Telephone Encounter (Signed)
NP Bariatric Consult     Pt is a 53 y/o married Latina female who conducted her initial bariatric consultation with Dr. Retta Diones. CSW reviewed pt's chart and conferred with care team. Per team, pt has been enrolled into [facility] Bariatric program. Pt will be required to complete 6 months of weight management.      CSW contacted pt. CSW introduced self and SW role. Pt resides in the city of [place], [place] with her spouse and children. Pt is unemployed. Pt's spouse is sole provider.      Pt's denies tobacco, alcohol, or illicit substance use. CSW educated pt on importance of abstaining from any substance pre and post-surgery.     Pt denies current and h/o mood or thought disorders, psychotic disorders, psychiatric treatment and hospitalizations, or SI/SA/HA. She denied psychiatric history. Pt explained her ongoing medical issues which make it challenging for her to follow dietary recommendations and exercise. Pt is not currently exercising and has no motivation/energy to do it. Pt reports she also doesn't eat and finds even the smell of food makes her nauseas. CSW explained the importance of seeking other protein sources to get some calories in. CSW acknowledged dietitian has addressed diet with her and suggested what pt can do, but pt finds this challenging. Pt reports her daughter has undergone the sleeve procedure and she is aware of what she needs to do. She is aware the recommendation is to proceed with the bypass procedure due to her acid reflux. CSW explained importance of following diet and exercise recommendations pre and post-surgery for overall weight loss success and to prevent weight re-gain. CSW explained the lifestyle changes necessary to be successful post-surgery. CSW explained opportunity pt has to start making some adjustments during the 6 months of weight management. Pt agreed and plans to implement these changes.      CSW prompted discussion re: bariatric surgery process. CSW educated pt on  obtaining psychological evaluation, 3-5 yrs of medical records including 6 months of weight management, and completing EMMI videos assigned in place of attending [facility] bariatric informational session. CSW provided with contact information for arising needs. CSW will continue to support through transition. Pt will call CSW back with an email address to assign EMMI videos. Darrold Junker, LCSW [phone]

## 2019-05-16 ENCOUNTER — Ambulatory Visit (INDEPENDENT_AMBULATORY_CARE_PROVIDER_SITE_OTHER): Payer: Medicaid Other

## 2019-05-16 DIAGNOSIS — Z3042 Encounter for surveillance of injectable contraceptive: Secondary | ICD-10-CM

## 2019-05-16 DIAGNOSIS — N939 Abnormal uterine and vaginal bleeding, unspecified: Secondary | ICD-10-CM

## 2019-05-16 MED ORDER — MEDROXYPROGESTERONE ACETATE 150 MG/ML IM SUSY
150.0000 mg | PREFILLED_SYRINGE | Freq: Once | INTRAMUSCULAR | Status: AC
Start: 2019-05-16 — End: 2019-05-16
  Administered 2019-05-16: 150 mg via INTRAMUSCULAR

## 2019-05-16 NOTE — Interdisciplinary (Signed)
PATIENT HERE FOR DEPO PROVERA INJECTION AS PER DR.Patel ORDERS  FOR AUB,  DEPO PROVERA 150MG  GIVEN IM Right Arm. PT TOLERATED WELL. NEXT INJECTION 01/25 thru 08/14/18    [place] [id number]  LOT [id number]  EXP 05/06/2022

## 2019-06-08 ENCOUNTER — Telehealth: Payer: Self-pay

## 2019-06-08 MED ORDER — BLOOD PRESSURE MONITOR/ARM DEVI
Status: AC
Start: 2019-05-17 — End: ?

## 2019-06-08 NOTE — Telephone Encounter (Signed)
06/08/2019 3:43 pm      Follow up Telemed Appt.11:30 am    Patient mention she losing a lot hair and patient mention she had a depot provera shot on October and a week after she started developing rash on arms, legs, and now she has all over her  Chest.     Height: 5\' 3"   Weight: 265 lbs    Lab Prefer: [facility]    Fall: No   Smoke: No     Medications and Allergies has been review in Epic.    Intake Complete

## 2019-06-09 ENCOUNTER — Telehealth: Payer: Self-pay | Admitting: Ob/Gyn

## 2019-06-09 NOTE — Telephone Encounter (Addendum)
Telephone Note    Spanish Interpretor:  [phone]     I called patient and she verified her name and date of birth and that this was a good time to talk.     DMPA received 05/16/19. She reports the day after she noticed a rash on her forearm on contralateral arm from DMPA that progressed to the rest of her body sparsely.  She reports having her blood drawn on the contralateral arm prior but cannot remember which day. Has never had reaction to blood draw in the past. Rash looks like a circle the size of a quarter, others were red but not too dark and mosquito bite size. Reports they are itchy. She woke up yesterday with the rash moreso on her chest and became concerned. Denies being outside or changing soap, detergent, or foods.     Reports her vaginal bleeding is improved with no bleeding. Also reports she has lost weight since the injection.    Informed patient it is unclear if rash is associated with DMPA. It may be, given temporal relationship but it is odd that it would start on the other arm. Recommend hydrocortisone cream or benadryl if bothering her. Also recommend follow up with her PCP for further evaluation and characterization as it may be related to bugs, etc. All questions answered.    I reviewed precautions and reminded her that if she needed anything she could reach our clinic at [phone], or our [place] at [phone].    06/09/19  11:43 AM

## 2019-06-14 ENCOUNTER — Ambulatory Visit: Payer: Medicaid Other | Attending: Nurse Practitioner | Admitting: Nurse Practitioner

## 2019-06-14 ENCOUNTER — Encounter: Payer: Self-pay | Admitting: Nurse Practitioner

## 2019-06-14 DIAGNOSIS — K449 Diaphragmatic hernia without obstruction or gangrene: Secondary | ICD-10-CM | POA: Insufficient documentation

## 2019-06-14 DIAGNOSIS — Z6841 Body Mass Index (BMI) 40.0 and over, adult: Secondary | ICD-10-CM | POA: Insufficient documentation

## 2019-06-14 DIAGNOSIS — K2100 Gastro-esophageal reflux disease with esophagitis, without bleeding: Secondary | ICD-10-CM | POA: Insufficient documentation

## 2019-06-14 NOTE — Patient Instructions (Signed)
Goals set for next 4 weeks   Journal daily- weight, foods and drinks and exercise and bring log to next clinic visit.     Download My fitness pal app.     Plan  64 oz of water daily  30 grams of protein daily   You tube chair exercises,  Increase physical activity   Improve on quality of sleep - go to bed on time, wake up at the same set time everyday

## 2019-06-14 NOTE — Progress Notes (Signed)
Medical Weight Loss Management Visit  1 of 6    History of Present Illness:    HPI: Brandi Hart is a 53 year old female with PMH of GERD, CT proven  large hiatal hernia. Morbid obesity who was recently seen in clinic by Dr. Windy Kalata and it was determined that pt will benefit from  laparoscopic paraesophageal hernia repair and a roux-en-y gastric bypass surgery for weight loss vs a sleeve gastrectomy due to symptoms of GERD.    Work up ordered:  UGI- 04/11/19 [facility])   EM  Psych Eval  Sleep study     [facility] visits   #1 on 06/14/19-  265 lbs. HT 5'3, BMI 47      She reports taking Omeprazol 40 mg and Carafate daily to manage her acid reflux.     Pt understands the need for lifestyle changes and achieve some degree of weigh loss prior to  surgery; in order to achieve this gaol, pt is not keen about taking weigh loss medications at this time.   She is motivated to loose weight.       Review of Systems:   Constitutional- Negative for chills or fever  Neurological- Negative for headaches  Musculoskeletal- Negative for malaise, negative for joint pain  Cardiovascular- Negative for chest pain  Respiratory- Negative for cough or shortness of breath  Gastrointestinal- Negative for nausea, vomiting, abdominal pain, diarrhea, constipation, hematochezia or melena  Genitourinary- Negative for dysuria  Other pertinents were discussed with patient and negative.    12 point Review of Systems is negative other than what is noted above.     Personal Weight Goal: 127 lbs.  Heaviest weight as adult: 289 lbs at age 26  Lowest weight as adult: 127 lbs at age 71     Obesity related Comorbidities:   OSA: yes, does not have a CPAP  Hyperlipidemia- yes  Diabetes: no  HTN:yes  Degenerative Arthritis: yes  Venous Stasis disease: yes  GERD:  Yes, taking Omeprazol 40 mg and Carafate daily t         Diet: 24 hr recall:   Breakfast- 1 slice of bread,   Snack: no  Lunch: chicken soup with  potatoes and carrots  Dinner:  chicken soup  with  potatoes and carrots  Evening snack: no  Beverages:coffee    Sleep:   Hours of sleep per night: poor sleep    Exercise: none    Hydration: "depends"     Past Medical Hx:  Past Medical History:   Diagnosis Date   . Anemia    . Congenital kidney disease    . Coronary artery disease    . Fibroids    . Gastritis    . GI bleed    . Hypertension    . Hypothyroidism        Past Surgical Hx:  Past Surgical History:   Procedure Laterality Date   . CESAREAN SECTION, LOW TRANSVERSE     . ENDOMETRIAL ABLATION      x2   . TUBAL LIGATION      at time of C-section       Social Hx:  Social History     Socioeconomic History   . Marital status: Divorced     Spouse name: Not on file   . Number of children: Not on file   . Years of education: Not on file   . Highest education level: Not on file   Occupational History   .  Not on file   Social Needs   . Financial resource strain: Not on file   . Food insecurity     Worry: Not on file     Inability: Not on file   . Transportation needs     Medical: Not on file     Non-medical: Not on file   Tobacco Use   . Smoking status: Never Smoker   . Smokeless tobacco: Never Used   Substance and Sexual Activity   . Alcohol use: Never     Frequency: Never   . Drug use: Never   . Sexual activity: Yes     Partners: Male   Lifestyle   . Physical activity     Days per week: Not on file     Minutes per session: Not on file   . Stress: Not on file   Relationships   . Social Product manager on phone: Not on file     Gets together: Not on file     Attends religious service: Not on file     Active member of club or organization: Not on file     Attends meetings of clubs or organizations: Not on file     Relationship status: Not on file   . Intimate partner violence     Fear of current or ex partner: Not on file     Emotionally abused: Not on file     Physically abused: Not on file     Forced sexual activity: Not on file   Other Topics Concern   . Not on file   Social History Narrative    Lives in  [place], in an apartment, lives with 2 daughters and boyfriend. Feels safe.    Denies EtOH, tobacco, illicit drug use    Denies h/o assault or abuse       Family Hx:  Family History   Problem Relation Name Age of Onset   . Hypertension Mother     . Osteoporosis Mother     . Stroke Mother     . No Known Problems Sister     . No Known Problems Brother         Medications:  Current Outpatient Medications on File Prior to Visit   Medication Sig Dispense Refill   . acetaminophen-codeine (TYLENOL #3) 300-30 MG tablet TK 1/2 - 1 T PO Q 6 H PRN P     . albuterol 108 (90 Base) MCG/ACT inhaler INHALE 2 PUFFS PO Q 4 H FOR 90 DAYS     . amlodipine-benazepril (LOTREL) 10-40 MG capsule TK ONE C PO QD     . atorvastatin (LIPITOR) 40 MG tablet TK 1 T PO QD     . Blood Pressure Monitor/Arm DEVI U UTD     . famotidine (PEPCID) 20 MG tablet TK 1 T PO QHS PRN     . FLOVENT HFA 110 MCG/ACT inhaler INL 2 PFS PO BID     . furosemide (LASIX) 40 MG tablet TK 1 T PO  D PRN     . HYDROcodone-acetaminophen (NORCO) 5-325 MG tablet TK 1 T PO Q 6 H PRN SEVERE PAIN     . levothyroxine (SYNTHROID) 125 MCG tablet Take 125 mcg by mouth every morning (before breakfast).     . metoprolol tartrate (LOPRESSOR) 25 MG tablet Take 25 mg by mouth 2 times daily.     . ondansetron (ZOFRAN ODT) 4 MG disintegrating tablet DIS 1  T ON THE TONGUE Q 6 H PRF NAUSEA OR VOM     . pantoprazole (PROTONIX) 40 MG tablet TK 1 T PO DAILY     . venlafaxine (EFFEXOR XR) 37.5 MG XR capsule TK 1 C PO QD WF     . VITAMIN D3 25 MCG (1000 UT) capsule TK 1 C PO BID       No current facility-administered medications on file prior to visit.        Allergies:  No Known Allergies          Physical Exam:  There were no vitals taken for this visit.  General:    Alert and oriented, in NAD  Psych: Cooperative mood and affect      Assessment   Brandi Hart is a 53 year old female with PMH of GERD, CT proven  large hiatal hernia. Morbid obesity who was recently seen in clinic by Dr.  Windy Kalata and it was determined that pt will benefit from  laparoscopic paraesophageal hernia repair and a roux-en-y gastric bypass surgery for weight loss vs a sleeve gastrectomy due to symptoms of GERD who is here for med weight mgt.    -Discussed that bariatric surgery requires a commitment to permanent change in lifestyle     -Encouraged regular exercise through combination of aerobic/resistance/weight training.     -Discussed that beyond weight control, there are metabolic benefits from exercising which include improved glycemic control, improvement in blood pressure, decreased risk for developing or progression of diabetes, and improvement in mood.     -Patient understands the need to take a multivitamin with iron; calcium, vitamin D and vitamin B12 supplementation for the rest of their life after sleeve gastrectomy    -Eat slowly. Savor the smell and texture of your food. In order to slow down eating, instead of a fork, use chopsticks or non-dominant hand, stop eating at or before the point of fullness.      Plan  64 oz of water daily  30 grams of protein daily minimum, plan to go up to 60 gms of protein per day.   You tube chair exercises,  Increase physical activity   Improve on quality of sleep - go to bed on time, wake up at the same set time everyday  RTC in 4 weeks     Due to COVID-19 pandemic and a federally declared state of public health emergency, this telemedicine visit was conducted Audio Only. Total time spent was 21+ minutes.

## 2019-06-23 ENCOUNTER — Telehealth: Payer: Self-pay | Admitting: Ob/Gyn

## 2019-06-23 NOTE — Telephone Encounter (Signed)
Call transferred to [facility], thank you

## 2019-06-23 NOTE — Telephone Encounter (Signed)
Silvester from [facility] states patient has been bleeding for 2 weeks now with pads being changed every hour , requesting to speak to nurse. Connected to the office

## 2019-06-23 NOTE — Telephone Encounter (Signed)
Received call from [place]. Pt agreed to go to ER at [facility].

## 2019-06-23 NOTE — Telephone Encounter (Signed)
Spoke with case mgr bleeding a pad and hour (sometimes up to 4). A little lightheaded. Refused er as afraid of covid. Asked if md had any other options,ie  meds that can be given. Notified md is in or but will attempt to contact MD.   Paged dr patel.

## 2019-06-23 NOTE — Telephone Encounter (Signed)
D/W Dr. Posey Pronto. Recommend ER. Silvester notified.

## 2019-06-23 NOTE — Telephone Encounter (Signed)
Silvester from [facility] requesting a call back regarding patient. He stated she has been bleeding for 2 weeks and they are trying to help patient in what they can do to alleviate the issue. Please contact. Thank you

## 2019-07-11 ENCOUNTER — Telehealth: Payer: Self-pay

## 2019-07-11 MED ORDER — PREDNISONE 20 MG OR TABS
20.00 mg | ORAL_TABLET | Freq: Two times a day (BID) | ORAL | Status: AC
Start: 2019-06-16 — End: ?

## 2019-07-11 MED ORDER — TRAZODONE HCL 50 MG OR TABS
50.00 mg | ORAL_TABLET | Freq: Every evening | ORAL | Status: AC
Start: 2019-06-20 — End: ?

## 2019-07-11 MED ORDER — ACYCLOVIR 400 MG OR TABS
400.00 mg | ORAL_TABLET | Freq: Three times a day (TID) | ORAL | Status: AC
Start: 2019-06-16 — End: ?

## 2019-07-11 MED ORDER — ACETAMINOPHEN EXTRA STRENGTH 500 MG OR TABS
ORAL_TABLET | ORAL | Status: AC
Start: 2019-06-20 — End: ?

## 2019-07-11 MED ORDER — FOLIC ACID 1 MG OR TABS
1.00 mg | ORAL_TABLET | Freq: Every day | ORAL | Status: AC
Start: 2019-06-29 — End: ?

## 2019-07-11 MED ORDER — LORATADINE 10 MG OR TABS
10.00 mg | ORAL_TABLET | ORAL | Status: AC | PRN
Start: 2019-06-27 — End: ?

## 2019-07-11 NOTE — Telephone Encounter (Signed)
Per patient  HT:  5 3      WT 261 lbs    Reviewed: 07/11/2019    Medications: Reviewed  Allergies:NKA  Pharmacy:[facility]  Lab preference:[facility]  Smoking status: No  Fallen in the past 6 months:No  Intrptr services needed: No

## 2019-07-12 ENCOUNTER — Ambulatory Visit: Payer: Medicaid Other | Attending: Nurse Practitioner | Admitting: Nurse Practitioner

## 2019-07-12 ENCOUNTER — Ambulatory Visit: Payer: Medicaid Other

## 2019-07-12 DIAGNOSIS — Z6841 Body Mass Index (BMI) 40.0 and over, adult: Secondary | ICD-10-CM | POA: Insufficient documentation

## 2019-07-12 DIAGNOSIS — K449 Diaphragmatic hernia without obstruction or gangrene: Secondary | ICD-10-CM | POA: Insufficient documentation

## 2019-07-12 DIAGNOSIS — K2100 Gastro-esophageal reflux disease with esophagitis, without bleeding: Secondary | ICD-10-CM | POA: Insufficient documentation

## 2019-07-12 NOTE — Progress Notes (Signed)
Medical Weight Loss Management Visit  2 of 6    History of Present Illness:    HPI: Brandi Hart is a 54 year old female with PMH of GERD, CT proven large hiatal hernia, morbid obesity who was recently seen in clinic by Dr. Windy Kalata and it was determined that pt will benefit from laparoscopic paraesophageal hernia repair and a roux-en-y gastric bypass surgery for weight loss vs a sleeve gastrectomy due to symptoms of GERD.    Pt understands the need for lifestyle changes and achieve some degree of weigh loss prior to  surgery; in order to achieve this gaol, pt is not keen about taking weigh loss medications at this time.   How, motivated/not motivated.      Work up ordered:  UGI- 04/11/19 [facility]) -5 cm sliding hiatus hernia  CT - 04/21/19 - Large Hiatal Hernia  EM - to be done   Psych Eval- to be done.   Sleep Study- to be done        [facility] visits   #1 on 06/14/19-  265 lbs. HT 5'3, BMI 47  # 2 on 07/12/19-    264 lbs.  Weight loss- 1 lb      Review of Systems:   Constitutional- Negative for chills or fever  Neurological- Negative for headaches  Musculoskeletal- Negative for malaise, negative for joint pain  Cardiovascular- Negative for chest pain  Respiratory- Negative for cough or shortness of breath  Gastrointestinal- Negative for nausea, vomiting, abdominal pain, diarrhea, constipation, hematochezia or melena  Genitourinary- Negative for dysuria  Other pertinents were discussed with patient and negative.    12 point Review of Systems is negative other than what is noted above.     Obesity related Comorbidities:   OSA: yes, does not have CPAP  Hyperlipidemia- yes  Diabetes: no   HTN: yes  Degenerative Arthritis:  yes  Venous Stasis disease:   GERD: yes, Pantoprazole 40 mg, once a day and Carafate once day, break thru daily, taking Rolaids to manage         Diet: 24 hr recall:   Breakfast- coffee  Snack: none  Lunch: skipped  Dinner: chicken soup   Evening snack: tea  Beverages: tea, coffee,  water,    Sleep:   Hours of sleep per night: 3 to 7, has insomnia,  Sleep quality: good        Exercise:  Walking - 20 mins every other day    Hydration: 60      Past Medical Hx:  Past Medical History:   Diagnosis Date   . Anemia    . Congenital kidney disease    . Coronary artery disease    . Fibroids    . Gastritis    . GI bleed    . Hypertension    . Hypothyroidism        Past Surgical Hx:  Past Surgical History:   Procedure Laterality Date   . CESAREAN SECTION, LOW TRANSVERSE     . ENDOMETRIAL ABLATION      x2   . TUBAL LIGATION      at time of C-section       Social Hx:  Social History     Socioeconomic History   . Marital status: Divorced     Spouse name: Not on file   . Number of children: Not on file   . Years of education: Not on file   . Highest education level: Not on file  Occupational History   . Not on file   Social Needs   . Financial resource strain: Not on file   . Food insecurity     Worry: Not on file     Inability: Not on file   . Transportation needs     Medical: Not on file     Non-medical: Not on file   Tobacco Use   . Smoking status: Never Smoker   . Smokeless tobacco: Never Used   Substance and Sexual Activity   . Alcohol use: Never     Frequency: Never   . Drug use: Never   . Sexual activity: Yes     Partners: Male   Lifestyle   . Physical activity     Days per week: Not on file     Minutes per session: Not on file   . Stress: Not on file   Relationships   . Social Product manager on phone: Not on file     Gets together: Not on file     Attends religious service: Not on file     Active member of club or organization: Not on file     Attends meetings of clubs or organizations: Not on file     Relationship status: Not on file   . Intimate partner violence     Fear of current or ex partner: Not on file     Emotionally abused: Not on file     Physically abused: Not on file     Forced sexual activity: Not on file   Other Topics Concern   . Not on file   Social History Narrative    Lives  in [place], in an apartment, lives with 2 daughters and boyfriend. Feels safe.    Denies EtOH, tobacco, illicit drug use    Denies h/o assault or abuse       Family Hx:  Family History   Problem Relation Name Age of Onset   . Hypertension Mother     . Osteoporosis Mother     . Stroke Mother     . No Known Problems Sister     . No Known Problems Brother         Medications:  Current Outpatient Medications on File Prior to Visit   Medication Sig Dispense Refill   . ACETAMINOPHEN EXTRA STRENGTH 500 MG tablet TAKE 1 TABLET BY MOUTH EVERY 4 TO 6 HOURS AS NEEDED     . acetaminophen-codeine (TYLENOL #3) 300-30 MG tablet TK 1/2 - 1 T PO Q 6 H PRN P     . acyclovir (ZOVIRAX) 400 MG tablet Take 400 mg by mouth 3 times daily.     Marland Kitchen albuterol 108 (90 Base) MCG/ACT inhaler INHALE 2 PUFFS PO Q 4 H FOR 90 DAYS     . amlodipine-benazepril (LOTREL) 10-40 MG capsule TK ONE C PO QD     . atorvastatin (LIPITOR) 40 MG tablet TK 1 T PO QD     . Blood Pressure Monitor/Arm DEVI U UTD     . famotidine (PEPCID) 20 MG tablet TK 1 T PO QHS PRN     . FLOVENT HFA 110 MCG/ACT inhaler INL 2 PFS PO BID     . folic acid (FOLVITE) 1 MG tablet Take 1 mg by mouth daily.     . furosemide (LASIX) 40 MG tablet TK 1 T PO  D PRN     . HYDROcodone-acetaminophen (NORCO) 5-325 MG  tablet TK 1 T PO Q 6 H PRN SEVERE PAIN     . levothyroxine (SYNTHROID) 125 MCG tablet Take 125 mcg by mouth every morning (before breakfast).     Marland Kitchen loratadine (CLARITIN) 10 MG tablet Take 10 mg by mouth daily.     . metoprolol tartrate (LOPRESSOR) 25 MG tablet Take 25 mg by mouth 2 times daily.     . ondansetron (ZOFRAN ODT) 4 MG disintegrating tablet DIS 1 T ON THE TONGUE Q 6 H PRF NAUSEA OR VOM     . pantoprazole (PROTONIX) 40 MG tablet TK 1 T PO DAILY     . predniSONE (DELTASONE) 20 MG tablet Take 20 mg by mouth 2 times daily.     . traZODone (DESYREL) 50 MG tablet Take 50 mg by mouth at bedtime.     Marland Kitchen venlafaxine (EFFEXOR XR) 37.5 MG XR capsule TK 1 C PO QD WF     . VITAMIN  D3 25 MCG (1000 UT) capsule TK 1 C PO BID       No current facility-administered medications on file prior to visit.        Allergies:  No Known Allergies          Physical Exam:  There were no vitals taken for this visit.  General:    Alert and oriented, in NAD  Psych: Cooperative mood and affect    Relevant workup to date is summarized below:    04/21/2019: CT Abdomen w/ and w/o Contrast [facility])  FINDINGS:   CT abdomen: Lung bases clear no pleural or pericardial fluid. Large hiatal hernia is identified.  Liver gallbladder biliary tree pancreas and spleen are normal. No adrenal masses. 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 No free fluid or free air. There is no  retroperitoneal or mesenteric lymphadenopathy. There is calcification the abdominal aorta without aneurysm.  Small umbilical hernia contains fat. Mild degenerative changes lumbar spine. There is diverticulosis of the  proximal sigmoid colon without diverticulitis    IMPRESSION:   Large hiatal hernia  Horseshoe type kidney    04/11/2011: Upper GI [facility])  FINDINGS:   Fluoroscopic and film examination demonstrate no obstructions within the esophagus. There is  approximately 5 cm hiatus hernia of the stomach associated with severe gastroesophageal reflux. The stomach  otherwise, fills normally with contrast and it is free from ulcerations, filling defects, irregularities or abnormal  extrinsic compression. The gastric rugal folds are in the normal range and normal gastric peristaltic activity.  The duodenal bulb, loop, and the visualized proximal small bowel are within normal limits.    IMPRESSION:  1. Approximately 5 cm sliding hiatus hernia of the stomach associated with severe gastroesophageal reflux.  2. No demonstrable ulcer crater, obstruction or other abnormality on the current study.          Assessment  Brandi Hart is a 54 year old female with  PMH of GERD, CT proven large hiatal hernia, morbid obesity who presents today for medical weight loss in anticipation of her bariatric surgery and planned hernia repair at the same time    -pt is motivated but is not on a daily exercise and hight protein diet regime.     -Discussed that bariatric surgery requires a commitment to permanent change in lifestyle     -Encouraged regular exercise through combination of aerobic/resistance/weight training.     -Discussed that beyond weight  control, there are metabolic benefits from exercising which include improved glycemic control, improvement in blood pressure, decreased risk for developing or progression of diabetes, and improvement in mood.     -Patient understands the need to take a multivitamin with iron; calcium, vitamin D and vitamin B12 supplementation for the rest of their life after sleeve gastrectomy      Plan   Increase protein intake   Declined RD  Increase exercise.   Get CPAP immediately- pt to contact insurance.   EGD ordered today   You tube chair exercises  Improve on quality of sleep  Practice mindful eating- do not eat while watching TV, driving or playing with your phone, this will prevent you from fully enjoying your food, be less satisfied with it and may continue to eat even when full.  Use timer or non dominant hand or chop sticks to prolong eating meals  Continue to eat lean protein, more non-starchy vegetables and drink plenty of fluids  RTC in 4 weeks.      Due to COVID-19 pandemic and a federally declared state of public health emergency, this telemedicine visit was conducted Audio Only. Total time spent was 21+ minutes.

## 2019-07-12 NOTE — Patient Instructions (Addendum)
Increase protein intake   Declined RD  Increase exercise.   Get CPAP immediately- pt to contact insurance.   You tube chair exercises  Improve on quality of sleep  Practice mindful eating- do not eat while watching TV, driving or playing with your phone, this will prevent you from fully enjoying your food, be less satisfied with it and may continue to eat even when full.  Use timer or non dominant hand or chop sticks to prolong eating meals  Continue to eat lean protein, more non-starchy vegetables and drink plenty of fluids  RTC in 4 weeks.      Contact the [facility] call center at [phone]

## 2019-07-13 ENCOUNTER — Encounter: Payer: Self-pay | Admitting: Nurse Practitioner

## 2019-08-05 ENCOUNTER — Telehealth: Payer: Self-pay

## 2019-08-05 NOTE — Telephone Encounter (Signed)
1st call-LVM in regards to telemed appointment with NP Varsha on 2/03 @ 1:30pm, requesting call back to go over intake,call centres number provided.

## 2019-08-10 ENCOUNTER — Ambulatory Visit: Payer: Medicaid Other | Attending: Nurse Practitioner | Admitting: Nurse Practitioner

## 2019-08-10 ENCOUNTER — Encounter: Payer: Self-pay | Admitting: Nurse Practitioner

## 2019-08-10 ENCOUNTER — Telehealth: Payer: Self-pay

## 2019-08-10 DIAGNOSIS — Z6841 Body Mass Index (BMI) 40.0 and over, adult: Secondary | ICD-10-CM | POA: Insufficient documentation

## 2019-08-10 DIAGNOSIS — K2100 Gastro-esophageal reflux disease with esophagitis, without bleeding: Secondary | ICD-10-CM | POA: Insufficient documentation

## 2019-08-10 DIAGNOSIS — K449 Diaphragmatic hernia without obstruction or gangrene: Secondary | ICD-10-CM | POA: Insufficient documentation

## 2019-08-10 NOTE — Telephone Encounter (Signed)
Called and spoke with patient for intake:       Per patient  HT:5'3''       WT:260 lbs    Reviewed:    Medications:Reviewed  Allergies:Reviewed  Pharmacy:Reviewed  Lab preference:[facility]  Smoking status: None Smoker   Fallen in the past 6 months:No  Screening questions  Referring provider  Intrptr services needed: Yes       Notified patient If Doctor sends any lab work for patient to perform it will be sent electronically to lab, any Rx will be sent to preferred pharmacy and AVS will be sent through [employer] message by doctors nurse or mailed out if patient doesn't have a myChart. Patient understood, no further questions.

## 2019-08-10 NOTE — Progress Notes (Signed)
Medical Weight Loss Management Visit   3 of 6    History of Present Illness:    HPI: Brandi Hart is a 54 year old female with PMH of GERD, CT provenlarge hiatal hernia, morbid obesity who was recently seen in clinic by Dr. Windy Kalata and it was determined that pt will benefit fromlaparoscopic paraesophageal hernia repair and a roux-en-y gastric bypass surgery for weight loss vs a sleeve gastrectomy due to symptoms of GERD.    She  presents to clinic today for medical weight loss consultation.      Pt understands the need for lifestyle changes and achieve some degree of weigh loss prior to  surgery; in order to achieve this gaol, pt is not keen about taking weigh loss medications at this time.   How, motivated/not motivated.    Spanish [id number]    Work up ordered:  [facility]- 04/11/19 [facility])-5 cm sliding hiatal hernia  CT - 04/21/19 - Large Hiatal Hernia  EM - to be done   Psych Eval- to be done.   Sleep Study- to be done, pt had an appointment, she canceled due to Story- not done.       MWL visits  #1 on 06/14/19- 265 lbs. HT 5'3, BMI 47  # 2 on 07/12/19-    264 lbs.  #3  on 08/10/19-    260 lbs.  Total weight loss- 5 lbs    Review of Systems:   Constitutional- Negative for chills or fever  Neurological- Negative for headaches  Musculoskeletal- Negative for malaise, negative for joint pain  Cardiovascular- Negative for chest pain  Respiratory- Negative for cough or shortness of breath  Gastrointestinal- Negative for nausea, vomiting, abdominal pain, diarrhea, constipation, hematochezia or melena  Genitourinary- Negative for dysuria  Other pertinents were discussed with patient and negative.    12 point Review of System is negative other than what is noted above.     Obesity related Comorbidities:   OSA: yes  Hyperlipidemia- yes  Diabetes: no  HTN: yes  Degenerative Arthritis: yes  Venous Stasis disease: no   GERD: yes, Pantoprazole 40 mg, once a day and Carafate once day, break thru daily, taking  upto six Rolaids to manage symptons       Diet: 24 hr recall:   Breakfast- coffee and toast  Snack:   Lunch: none  Dinner: chicken and rice  Evening snack: none  Beverages:coffee, water    Sleep: 6 hrs.    Exercise: walking for 20 mins two times a week    Hydration: 64 oz     Past Medical Hx:  Past Medical History:   Diagnosis Date   . Anemia    . Congenital kidney disease    . Coronary artery disease    . Fibroids    . Gastritis    . GI bleed    . Hypertension    . Hypothyroidism        Past Surgical Hx:  Past Surgical History:   Procedure Laterality Date   . CESAREAN SECTION, LOW TRANSVERSE     . ENDOMETRIAL ABLATION      x2   . TUBAL LIGATION      at time of C-section       Social Hx:  Social History     Socioeconomic History   . Marital status: Divorced     Spouse name: Not on file   . Number of children: Not on file   . Years  of education: Not on file   . Highest education level: Not on file   Occupational History   . Not on file   Social Needs   . Financial resource strain: Not on file   . Food insecurity     Worry: Not on file     Inability: Not on file   . Transportation needs     Medical: Not on file     Non-medical: Not on file   Tobacco Use   . Smoking status: Never Smoker   . Smokeless tobacco: Never Used   Substance and Sexual Activity   . Alcohol use: Never     Frequency: Never   . Drug use: Never   . Sexual activity: Yes     Partners: Male   Lifestyle   . Physical activity     Days per week: Not on file     Minutes per session: Not on file   . Stress: Not on file   Relationships   . Social Product manager on phone: Not on file     Gets together: Not on file     Attends religious service: Not on file     Active member of club or organization: Not on file     Attends meetings of clubs or organizations: Not on file     Relationship status: Not on file   . Intimate partner violence     Fear of current or ex partner: Not on file     Emotionally abused: Not on file     Physically abused: Not on file        Forced sexual activity: Not on file   Other Topics Concern   . Not on file   Social History Narrative    Lives in [place], in an apartment, lives with 2 daughters and boyfriend. Feels safe.    Denies EtOH, tobacco, illicit drug use    Denies h/o assault or abuse       Family Hx:  Family History   Problem Relation Name Age of Onset   . Hypertension Mother     . Osteoporosis Mother     . Stroke Mother     . No Known Problems Sister     . No Known Problems Brother         Medications:  Current Outpatient Medications on File Prior to Visit   Medication Sig Dispense Refill   . ACETAMINOPHEN EXTRA STRENGTH 500 MG tablet TAKE 1 TABLET BY MOUTH EVERY 4 TO 6 HOURS AS NEEDED     . acetaminophen-codeine (TYLENOL #3) 300-30 MG tablet TK 1/2 - 1 T PO Q 6 H PRN P     . acyclovir (ZOVIRAX) 400 MG tablet Take 400 mg by mouth 3 times daily.     Marland Kitchen albuterol 108 (90 Base) MCG/ACT inhaler INHALE 2 PUFFS PO Q 4 H FOR 90 DAYS     . amlodipine-benazepril (LOTREL) 10-40 MG capsule TK ONE C PO QD     . atorvastatin (LIPITOR) 40 MG tablet TK 1 T PO QD     . Blood Pressure Monitor/Arm DEVI U UTD     . famotidine (PEPCID) 20 MG tablet TK 1 T PO QHS PRN     . FLOVENT HFA 110 MCG/ACT inhaler INL 2 PFS PO BID     . folic acid (FOLVITE) 1 MG tablet Take 1 mg by mouth daily.     . furosemide (LASIX) 40  MG tablet TK 1 T PO  D PRN     . HYDROcodone-acetaminophen (NORCO) 5-325 MG tablet TK 1 T PO Q 6 H PRN SEVERE PAIN     . levothyroxine (SYNTHROID) 125 MCG tablet Take 125 mcg by mouth every morning (before breakfast).     Marland Kitchen loratadine (CLARITIN) 10 MG tablet Take 10 mg by mouth daily.     . metoprolol tartrate (LOPRESSOR) 25 MG tablet Take 25 mg by mouth 2 times daily.     . ondansetron (ZOFRAN ODT) 4 MG disintegrating tablet DIS 1 T ON THE TONGUE Q 6 H PRF NAUSEA OR VOM     . pantoprazole (PROTONIX) 40 MG tablet TK 1 T PO DAILY     . predniSONE (DELTASONE) 20 MG tablet Take 20 mg by mouth 2 times daily.     . traZODone (DESYREL) 50 MG  tablet Take 50 mg by mouth at bedtime.     Marland Kitchen venlafaxine (EFFEXOR XR) 37.5 MG XR capsule TK 1 C PO QD WF     . VITAMIN D3 25 MCG (1000 UT) capsule TK 1 C PO BID       No current facility-administered medications on file prior to visit.        Allergies:  No Known Allergies      Physical Exam:  There were no vitals taken for this visit.  General:    Alert and oriented, in NAD  Psych: Cooperative mood and affect    Assessment   Brandi Hart is a 54 year old female with PMH of GERD, CT provenlarge hiatal hernia, morbid obesity who was recently seen in clinic by Dr. Windy Kalata and it was determined that pt will benefit fromlaparoscopic paraesophageal hernia repair and a roux-en-y gastric bypass surgery for weight loss vs a sleeve gastrectomy due to symptoms of GERD.    -lost 5 lbs so far  -Discussed that bariatric surgery requires a commitment to permanent change in lifestyle   -Discussed the patients current diet with her.   -Reviewed portion sizes, dietary guidelines, and to decrease carb intake.   -Encouraged regular exercise through combination of aerobic/resistance/weight training.   -A deck of cards sized meat is 21 gms of protein.   -Patient understands the need to take a multivitamin with iron; calcium, vitamin D and vitamin B12 supplementation for the rest of their life after sleeve gastrectomy  -Discussed that increased physical activity is an essential component of a comprehensive lifestyle intervention. Physical activity is crucial for weight maintenance.    Plan  Increase amount of exercise.   You tube chair exercises  Continue to eat lean protein, more non-starchy vegetables and drink plenty of fluids  MSW to set up EMMY videos    Due to COVID-19 pandemic and a federally declared state of public health emergency, this telemedicine visit was conducted Audio Only. Total time spent was 21+ minutes.

## 2019-08-10 NOTE — Patient Instructions (Addendum)
Plan  Increase amount of exercise.   You tube chair exercises  Continue to eat lean protein, more non-starchy vegetables and drink plenty of fluids  MSW to set up [facility] Appointments: Contact the [facility] call center at [phone] and follow prompts.

## 2019-08-11 ENCOUNTER — Telehealth: Payer: Self-pay

## 2019-08-11 NOTE — Telephone Encounter (Signed)
[facility] Bariatric Consult     CSW received notification from NP, Cameron who conducted a weight management visit with pt regarding EMMI videos. CSW reviewed pt's chart and acknowledged pt was suppose to contact CSW back with a working email since her initial appointment.     CSW attempted to contact pt but unable to reach her. CSW left a message for pt requesting an email address to assign EMMI videos. CSW explained pt would be responsible to view and complete videos in order to move forward with bariatric surgery. CSW provided with contact information and requested a call/email back. Darrold Junker, [place]

## 2019-08-16 ENCOUNTER — Telehealth: Payer: Self-pay | Admitting: Gastroenterology

## 2019-08-16 NOTE — Telephone Encounter (Signed)
Triaged for EGD/BRAVO under [place].   Order initiated for COVID test.

## 2019-08-16 NOTE — Telephone Encounter (Signed)
HELLO Brandi Hart, THIS A NEW PT TP DR. KARNES & NEEDS TO GET AN EGD W. 48HR BRAVO PLEASE   1. REVIEW CHART  2. PLACE RX   3. COVID ORDERS  4. SEDATION TYPE  [place]

## 2019-08-17 ENCOUNTER — Encounter: Payer: Self-pay | Admitting: Gastroenterology

## 2019-08-18 ENCOUNTER — Telehealth: Payer: Self-pay

## 2019-08-18 NOTE — Telephone Encounter (Signed)
[facility] Bariatric Consult     CSW contacted pt. CSW explained her attempt to reach her previously regarding obtaining an email address to assign EMMI videos. CSW indicated waiting to hear back from pt with an email but never heard back. CSW waited while pt searched her phone and provided email addresss ([email or web address]). CSW also inquired about additional requirements such as psychological evaluation. Pt indicates reaching out to Dr. Jiles Crocker office but wasn't taking any patients and asked to call at a later time which she did and again was told the same thing. CSW advised pt to call other psychologists listed on the bariatric form. CSW also provided contact information for additional support. Darrold Junker, [place]

## 2019-09-01 ENCOUNTER — Telehealth: Payer: Self-pay

## 2019-09-01 NOTE — Telephone Encounter (Signed)
Patient is trying to schedule her 2nd Depo shot, nurse visit  please contact with appointment.  Thank you

## 2019-09-01 NOTE — Telephone Encounter (Signed)
Unable contact patient in regards intake will call back again-

## 2019-09-02 ENCOUNTER — Telehealth: Payer: Self-pay

## 2019-09-02 NOTE — Telephone Encounter (Signed)
Left message for patient advising to return my call to further assist her with scheduling her appointment, thank you.

## 2019-09-02 NOTE — Telephone Encounter (Signed)
Spoke with patient and assisted her with scheduling her appointment, thank you.

## 2019-09-02 NOTE — Telephone Encounter (Signed)
Patient calling to schedule depo shot for next week. Please contact. Thank you

## 2019-09-06 ENCOUNTER — Ambulatory Visit: Payer: Medicaid Other

## 2019-09-06 DIAGNOSIS — N939 Abnormal uterine and vaginal bleeding, unspecified: Secondary | ICD-10-CM

## 2019-09-06 LAB — URINE PREGNANCY, POINT OF CARE TESTING: Urine Pregnancy Result, Point of Care: NEGATIVE m[IU]/mL

## 2019-09-06 NOTE — Interdisciplinary (Signed)
Patient came in for 2nd DEPO vaccine however she went pass her date. It was suppose to be between 01/25- 02/08. She came in on 07/12/2019 and it was to early to give shot. A sticky note was given to patient that day with the dates she needed to come back, patient stated she will call the office. She called on 09/01/2019 and was schedule however its was to late. Today she came in and I explained everything. Per protocol a urine pregnancy test was collected and it was negative. She will need to come in 2 weeks to do another one and if it comes back negative we will give her DEPO shot. However patient states office is to far for her and she can't come back and fourth. Let her know she can also call her primary doctor and let them know if she can received DEPO in their office. Per patient she will call them to check. If she decides to come back she will call us .

## 2019-09-07 ENCOUNTER — Ambulatory Visit: Payer: Medicaid Other | Attending: Nurse Practitioner | Admitting: Nurse Practitioner

## 2019-09-07 ENCOUNTER — Encounter: Payer: Self-pay | Admitting: Nurse Practitioner

## 2019-09-07 DIAGNOSIS — K2100 Gastro-esophageal reflux disease with esophagitis, without bleeding: Secondary | ICD-10-CM | POA: Insufficient documentation

## 2019-09-07 DIAGNOSIS — K449 Diaphragmatic hernia without obstruction or gangrene: Secondary | ICD-10-CM | POA: Insufficient documentation

## 2019-09-07 DIAGNOSIS — Z6841 Body Mass Index (BMI) 40.0 and over, adult: Secondary | ICD-10-CM | POA: Insufficient documentation

## 2019-09-07 NOTE — Patient Instructions (Addendum)
Plan   Am I hungry handout to be mailed to pt.   You tube chair exercises  Continue to eat lean protein, more non-starchy vegetables and drink plenty of fluids  RTC in 4 weeks.  How do I keep a food log?  Immediately after each meal, record the following information in your food log:   What you ate. Don't forget to include toppings, sauces, and other extras on the food.   How much you ate. This can be measured in cups, ounces, or number of items.   How many calories each food and drink had.   The total number of calories in the meal.     Keep your food log near you, such as in a small notebook in your pocket, or use a mobile app or website. Some programs will calculate calories for you and show you how many calories you have left for the day to meet your goal.  What are some calorie counting tips?   Use your calories on foods and drinks that will fill you up and not leave you hungry:  ? Some examples of foods that fill you up are nuts and nut butters, vegetables, lean proteins, and high-fiber foods like whole grains. High-fiber foods are foods with more than 5 g fiber per serving.  ? Drinks such as sodas, specialty coffee drinks, alcohol, and juices have a lot of calories, yet do not fill you up.   Eat nutritious foods and avoid empty calories. Empty calories are calories you get from foods or beverages that do not have many vitamins or protein, such as candy, sweets, and soda. It is better to have a nutritious high-calorie food (such as an avocado) than a food with few nutrients (such as a bag of chips).   Know how many calories are in the foods you eat most often. This will help you calculate calorie counts faster.   Pay attention to calories in drinks. Low-calorie drinks include water and unsweetened drinks.   Pay attention to nutrition labels for "low fat" or "fat free" foods. These foods sometimes have the same amount of calories or more calories than the full fat versions. They also often have added  sugar, starch, or salt, to make up for flavor that was removed with the fat.      Find a way of tracking calories that works for you. Get creative. Try different apps or programs if writing down calories does not work for you.  What are some portion control tips?   Know how many calories are in a serving. This will help you know how many servings of a certain food you can have.   Use a measuring cup to measure serving sizes. You could also try weighing out portions on a kitchen scale. With time, you will be able to estimate serving sizes for some foods.   Take some time to put servings of different foods on your favorite plates, bowls, and cups so you know what a serving looks like.   Try not to eat straight from a bag or box. Doing this can lead to overeating. Put the amount you would like to eat in a cup or on a plate to make sure you are eating the right portion.   Use smaller plates, glasses, and bowls to prevent overeating.   Try not to multitask (for example, watch TV or use your computer) while eating. If it is time to eat, sit down at a table and enjoy your food.  This will help you to know when you are full. It will also help you to be aware of what you are eating and how much you are eating.  What are tips for following this plan?  Reading food labels   Check the calorie count compared to the serving size. The serving size may be smaller than what you are used to eating.   Check the source of the calories. Make sure the food you are eating is high in vitamins and protein and low in saturated and trans fats.  Shopping   Read nutrition labels while you shop. This will help you make healthy decisions before you decide to purchase your food.   Make a grocery list and stick to it.  Cooking   Try to cook your favorite foods in a healthier way. For example, try baking instead of frying.   Use low-fat dairy products.  Meal planning   Use more fruits and vegetables. Half of your plate should be fruits  and vegetables.   Include lean proteins like poultry and fish.  How do I count calories when eating out?   Ask for smaller portion sizes.   Consider sharing an entree and sides instead of getting your own entree.   If you get your own entree, eat only half. Ask for a box at the beginning of your meal and put the rest of your entree in it so you are not tempted to eat it.   If calories are listed on the menu, choose the lower calorie options.   Choose dishes that include vegetables, fruits, whole grains, low-fat dairy products, and lean protein.   Choose items that are boiled, broiled, grilled, or steamed. Stay away from items that are buttered, battered, fried, or served with cream sauce. Items labeled "crispy" are usually fried, unless stated otherwise.   Choose water, low-fat milk, unsweetened iced tea, or other drinks without added sugar. If you want an alcoholic beverage, choose a lower calorie option such as a glass of wine or light beer.   Ask for dressings, sauces, and syrups on the side. These are usually high in calories, so you should limit the amount you eat.   If you want a salad, choose a garden salad and ask for grilled meats. Avoid extra toppings like bacon, cheese, or fried items. Ask for the dressing on the side, or ask for olive oil and vinegar or lemon to use as dressing.   Estimate how many servings of a food you are given. For example, a serving of cooked rice is  cup or about the size of half a baseball. Knowing serving sizes will help you be aware of how much food you are eating at restaurants. The list below tells you how big or small some common portion sizes are based on everyday objects:  ? 1 oz--4 stacked dice.  ? 3 oz--1 deck of cards.  ? 1 tsp--1 die.  ? 1 Tbsp-- a ping-pong ball.  ? 2 Tbsp--1 ping-pong ball.  ?  cup-- baseball.  ? 1 cup--1 baseball.  Summary   Calorie counting means keeping track of how many calories you eat and drink each day. If you eat fewer calories  than your body needs, you should lose weight.   A healthy amount of weight to lose per week is usually 1-2 lb (0.5-0.9 kg). This usually means reducing your daily calorie intake by 500-750 calories.   The number of calories in a food can be found on  a Nutrition Facts label. If a food does not have a Nutrition Facts label, try to look up the calories online or ask your dietitian for help.   Use your calories on foods and drinks that will fill you up, and not on foods and drinks that will leave you hungry.   Use smaller plates, glasses, and bowls to prevent overeating    Contact the [facility] call center at [phone]

## 2019-09-07 NOTE — Progress Notes (Signed)
Medical Weight Loss Management Visit   4 of 6    History of Present Illness:    HPI: Brandi Hart is a 55 year old female with PMH of GERD, CT provenlarge hiatal hernia, morbid obesity who was recently seen in clinic by Dr. Windy Kalata and it was determined that pt will benefit fromlaparoscopic paraesophageal hernia repair and a roux-en-y gastric bypass surgery for weight loss vs a sleeve gastrectomy due to symptoms of GERD.      She presents to clinic today for medical weight loss consultation.      Pt understands the need for lifestyle changes and achieve some degree of weigh loss prior to  surgery; in order to achieve this gaol, pt is not keen about taking weigh loss medications at this time.     Work up ordered:  UGI- 04/11/19 [facility])-5 cm sliding hiatal hernia  CT - 04/21/19 - Large Hiatal Hernia  EM- to be done  Psych Eval- to be done.   Sleep Study- to be done, pt had an appointment, she canceled due to [place]- not done.       MWL visits  #1 on 06/14/19- 265 lbs. HT 5'3, BMI 47  # 2 on 07/12/19- 264 lbs.  #3  on 08/10/19-    260 lbs.  #4 on 09/07/19-    258 lbs    Total weight loss - 7 lbs      Review of Systems:   Constitutional- Negative for chills or fever  Neurological- Negative for headaches  Musculoskeletal- Negative for malaise, negative for joint pain  Cardiovascular- Negative for chest pain  Respiratory- Negative for cough or shortness of breath  Gastrointestinal- Negative for nausea, vomiting, abdominal pain, diarrhea, constipation, hematochezia or melena  Genitourinary- Negative for dysuria  Other pertinents were discussed with patient and negative.    12 point Review of System is negative other than what is noted above.        Diet: 24 hr recall:   Breakfast- 1 egg, coffee  Snack: carrots  Lunch: meat soup  Dinner: none  Evening snack: apple  Beverages: coffee, water    Sleep:  6 to 7 hours     Exercise: walking 30 mins every day    Hydration: 64 oz    Obesity related  Comorbidities:   OSA: yes  Hyperlipidemia- yes  Diabetes: no  HTN: yes  Degenerative Arthritis: yes  Venous Stasis disease: no   GERD: yes, Pantoprazole 40 mg, once a day and Carafate once day, break thru daily, taking upto six Rolaidsto manage symptons       Past Medical Hx:  Past Medical History:   Diagnosis Date   . Anemia    . Congenital kidney disease    . Coronary artery disease    . Fibroids    . Gastritis    . GI bleed    . Hypertension    . Hypothyroidism        Past Surgical Hx:  Past Surgical History:   Procedure Laterality Date   . CESAREAN SECTION, LOW TRANSVERSE     . ENDOMETRIAL ABLATION      x2   . TUBAL LIGATION      at time of C-section       Social Hx:  Social History     Socioeconomic History   . Marital status: Divorced     Spouse name: Not on file   . Number of children: Not on file   .  Years of education: Not on file   . Highest education level: Not on file   Occupational History   . Not on file   Social Needs   . Financial resource strain: Not on file   . Food insecurity     Worry: Not on file     Inability: Not on file   . Transportation needs     Medical: Not on file     Non-medical: Not on file   Tobacco Use   . Smoking status: Never Smoker   . Smokeless tobacco: Never Used   Substance and Sexual Activity   . Alcohol use: Never     Frequency: Never   . Drug use: Never   . Sexual activity: Yes     Partners: Male   Lifestyle   . Physical activity     Days per week: Not on file     Minutes per session: Not on file   . Stress: Not on file   Relationships   . Social Product manager on phone: Not on file     Gets together: Not on file     Attends religious service: Not on file     Active member of club or organization: Not on file     Attends meetings of clubs or organizations: Not on file     Relationship status: Not on file   . Intimate partner violence     Fear of current or ex partner: Not on file     Emotionally abused: Not on file     Physically abused: Not on file     Forced  sexual activity: Not on file   Other Topics Concern   . Not on file   Social History Narrative    Lives in [place], in an apartment, lives with 2 daughters and boyfriend. Feels safe.    Denies EtOH, tobacco, illicit drug use    Denies h/o assault or abuse       Family Hx:  Family History   Problem Relation Name Age of Onset   . Hypertension Mother     . Osteoporosis Mother     . Stroke Mother     . No Known Problems Sister     . No Known Problems Brother         Medications:  Current Outpatient Medications on File Prior to Visit   Medication Sig Dispense Refill   . ACETAMINOPHEN EXTRA STRENGTH 500 MG tablet TAKE 1 TABLET BY MOUTH EVERY 4 TO 6 HOURS AS NEEDED     . acetaminophen-codeine (TYLENOL #3) 300-30 MG tablet TK 1/2 - 1 T PO Q 6 H PRN P     . acyclovir (ZOVIRAX) 400 MG tablet Take 400 mg by mouth 3 times daily.     Marland Kitchen albuterol 108 (90 Base) MCG/ACT inhaler INHALE 2 PUFFS PO Q 4 H FOR 90 DAYS     . amlodipine-benazepril (LOTREL) 10-40 MG capsule TK ONE C PO QD     . atorvastatin (LIPITOR) 40 MG tablet TK 1 T PO QD     . Blood Pressure Monitor/Arm DEVI U UTD     . famotidine (PEPCID) 20 MG tablet TK 1 T PO QHS PRN     . FLOVENT HFA 110 MCG/ACT inhaler INL 2 PFS PO BID     . folic acid (FOLVITE) 1 MG tablet Take 1 mg by mouth daily.     . furosemide (LASIX) 40 MG  tablet TK 1 T PO  D PRN     . HYDROcodone-acetaminophen (NORCO) 5-325 MG tablet TK 1 T PO Q 6 H PRN SEVERE PAIN     . levothyroxine (SYNTHROID) 125 MCG tablet Take 125 mcg by mouth every morning (before breakfast).     Marland Kitchen loratadine (CLARITIN) 10 MG tablet Take 10 mg by mouth daily.     . metoprolol tartrate (LOPRESSOR) 25 MG tablet Take 25 mg by mouth 2 times daily.     . ondansetron (ZOFRAN ODT) 4 MG disintegrating tablet DIS 1 T ON THE TONGUE Q 6 H PRF NAUSEA OR VOM     . pantoprazole (PROTONIX) 40 MG tablet TK 1 T PO DAILY     . predniSONE (DELTASONE) 20 MG tablet Take 20 mg by mouth 2 times daily.     . traZODone (DESYREL) 50 MG tablet Take 50  mg by mouth at bedtime.     Marland Kitchen venlafaxine (EFFEXOR XR) 37.5 MG XR capsule TK 1 C PO QD WF     . VITAMIN D3 25 MCG (1000 UT) capsule TK 1 C PO BID       No current facility-administered medications on file prior to visit.        Allergies:  No Known Allergies          Physical Exam:  There were no vitals taken for this visit.  General:    Alert and oriented, in NAD  Psych: Cooperative mood and affect    Assessment  Brandi Hart is a 54 year old female with PMH of GERD, CT provenlarge hiatal hernia, morbid obesity who presents for medical weight loss.  She is motivated to loose weight  She has lost 7 lbs so far    -Discussed that bariatric surgery requires a commitment to permanent change in lifestyle   -Discussed the patients current diet with her.   -Reviewed portion sizes, dietary guidelines, and to decrease carb intake.   -Encouraged regular exercise through combination of aerobic/resistance/weight training.   --Discussed a deck of cards sized chicken/meat is about  21 gms of protein.   -Discussed that beyond weight control, there are metabolic benefits from exercising which include improved glycemic control, improvement in blood pressure, decreased risk for developing or progression of diabetes, and improvement in mood.       Plan   Am I hungry handout to be mailed to pt.   You tube chair exercises  Continue to eat lean protein, more non-starchy vegetables and drink plenty of fluids  RTC in 4 weeks.  How do I keep a food log?  Immediately after each meal, record the following information in your food log:   What you ate. Don't forget to include toppings, sauces, and other extras on the food.   How much you ate. This can be measured in cups, ounces, or number of items.   How many calories each food and drink had.   The total number of calories in the meal.     Keep your food log near you, such as in a small notebook in your pocket, or use a mobile app or website. Some programs will calculate calories for  you and show you how many calories you have left for the day to meet your goal.  What are some calorie counting tips?   Use your calories on foods and drinks that will fill you up and not leave you hungry:  ? Some examples of foods that fill you  up are nuts and nut butters, vegetables, lean proteins, and high-fiber foods like whole grains. High-fiber foods are foods with more than 5 g fiber per serving.  ? Drinks such as sodas, specialty coffee drinks, alcohol, and juices have a lot of calories, yet do not fill you up.   Eat nutritious foods and avoid empty calories. Empty calories are calories you get from foods or beverages that do not have many vitamins or protein, such as candy, sweets, and soda. It is better to have a nutritious high-calorie food (such as an avocado) than a food with few nutrients (such as a bag of chips).   Know how many calories are in the foods you eat most often. This will help you calculate calorie counts faster.   Pay attention to calories in drinks. Low-calorie drinks include water and unsweetened drinks.   Pay attention to nutrition labels for "low fat" or "fat free" foods. These foods sometimes have the same amount of calories or more calories than the full fat versions. They also often have added sugar, starch, or salt, to make up for flavor that was removed with the fat.      Find a way of tracking calories that works for you. Get creative. Try different apps or programs if writing down calories does not work for you.  What are some portion control tips?   Know how many calories are in a serving. This will help you know how many servings of a certain food you can have.   Use a measuring cup to measure serving sizes. You could also try weighing out portions on a kitchen scale. With time, you will be able to estimate serving sizes for some foods.   Take some time to put servings of different foods on your favorite plates, bowls, and cups so you know what a serving looks  like.   Try not to eat straight from a bag or box. Doing this can lead to overeating. Put the amount you would like to eat in a cup or on a plate to make sure you are eating the right portion.   Use smaller plates, glasses, and bowls to prevent overeating.   Try not to multitask (for example, watch TV or use your computer) while eating. If it is time to eat, sit down at a table and enjoy your food. This will help you to know when you are full. It will also help you to be aware of what you are eating and how much you are eating.  What are tips for following this plan?  Reading food labels   Check the calorie count compared to the serving size. The serving size may be smaller than what you are used to eating.   Check the source of the calories. Make sure the food you are eating is high in vitamins and protein and low in saturated and trans fats.  Shopping   Read nutrition labels while you shop. This will help you make healthy decisions before you decide to purchase your food.   Make a grocery list and stick to it.  Cooking   Try to cook your favorite foods in a healthier way. For example, try baking instead of frying.   Use low-fat dairy products.  Meal planning   Use more fruits and vegetables. Half of your plate should be fruits and vegetables.   Include lean proteins like poultry and fish.  How do I count calories when eating out?   Ask for smaller  portion sizes.   Consider sharing an entree and sides instead of getting your own entree.   If you get your own entree, eat only half. Ask for a box at the beginning of your meal and put the rest of your entree in it so you are not tempted to eat it.   If calories are listed on the menu, choose the lower calorie options.   Choose dishes that include vegetables, fruits, whole grains, low-fat dairy products, and lean protein.   Choose items that are boiled, broiled, grilled, or steamed. Stay away from items that are buttered, battered, fried, or served  with cream sauce. Items labeled "crispy" are usually fried, unless stated otherwise.   Choose water, low-fat milk, unsweetened iced tea, or other drinks without added sugar. If you want an alcoholic beverage, choose a lower calorie option such as a glass of wine or light beer.   Ask for dressings, sauces, and syrups on the side. These are usually high in calories, so you should limit the amount you eat.   If you want a salad, choose a garden salad and ask for grilled meats. Avoid extra toppings like bacon, cheese, or fried items. Ask for the dressing on the side, or ask for olive oil and vinegar or lemon to use as dressing.   Estimate how many servings of a food you are given. For example, a serving of cooked rice is  cup or about the size of half a baseball. Knowing serving sizes will help you be aware of how much food you are eating at restaurants. The list below tells you how big or small some common portion sizes are based on everyday objects:  ? 1 oz--4 stacked dice.  ? 3 oz--1 deck of cards.  ? 1 tsp--1 die.  ? 1 Tbsp-- a ping-pong ball.  ? 2 Tbsp--1 ping-pong ball.  ?  cup-- baseball.  ? 1 cup--1 baseball.  Summary   Calorie counting means keeping track of how many calories you eat and drink each day. If you eat fewer calories than your body needs, you should lose weight.   A healthy amount of weight to lose per week is usually 1-2 lb (0.5-0.9 kg). This usually means reducing your daily calorie intake by 500-750 calories.   The number of calories in a food can be found on a Nutrition Facts label. If a food does not have a Nutrition Facts label, try to look up the calories online or ask your dietitian for help.   Use your calories on foods and drinks that will fill you up, and not on foods and drinks that will leave you hungry.   Use smaller plates, glasses, and bowls to prevent overeating      Due to COVID-19 pandemic and a federally declared state of public health emergency, this telemedicine  visit was conducted Audio Only. Total time spent was 21+ minutes.

## 2019-09-08 ENCOUNTER — Encounter: Payer: Self-pay | Admitting: Gastroenterology

## 2019-09-16 ENCOUNTER — Encounter: Payer: Self-pay | Admitting: Gastroenterology

## 2019-09-21 ENCOUNTER — Ambulatory Visit (INDEPENDENT_AMBULATORY_CARE_PROVIDER_SITE_OTHER): Payer: Medicaid Other

## 2019-09-21 DIAGNOSIS — N939 Abnormal uterine and vaginal bleeding, unspecified: Secondary | ICD-10-CM

## 2019-09-21 LAB — URINE PREGNANCY, POINT OF CARE TESTING: Urine Pregnancy Result, Point of Care: NEGATIVE m[IU]/mL

## 2019-09-21 MED ORDER — MEDROXYPROGESTERONE ACETATE 150 MG/ML IM SUSY
150.0000 mg | PREFILLED_SYRINGE | Freq: Once | INTRAMUSCULAR | Status: AC
Start: 2019-09-21 — End: 2019-09-21
  Administered 2019-09-21 (×2): 150 mg via INTRAMUSCULAR

## 2019-09-21 NOTE — Interdisciplinary (Signed)
[place]  [id number]

## 2019-09-21 NOTE — Interdisciplinary (Signed)
PATIENT HERE FOR DEPO PROVERA INJECTION AS PER DR.CHUBAS ORDERS  FOR AUB,  DEPO PROVERA 150MG  GIVEN IM RIGHT ARM. PT TOLERATED WELL. NEXT INJECTION June 2,2021 thru June 16,2021.

## 2019-10-05 ENCOUNTER — Ambulatory Visit: Payer: Medicaid Other | Admitting: Nurse Practitioner

## 2019-10-11 ENCOUNTER — Ambulatory Visit: Payer: Medicaid Other | Admitting: Surgery

## 2019-10-18 ENCOUNTER — Ambulatory Visit: Payer: Medicaid Other | Attending: Nurse Practitioner | Admitting: Nurse Practitioner

## 2019-10-18 ENCOUNTER — Encounter: Payer: Self-pay | Admitting: Nurse Practitioner

## 2019-10-18 DIAGNOSIS — K449 Diaphragmatic hernia without obstruction or gangrene: Secondary | ICD-10-CM | POA: Insufficient documentation

## 2019-10-18 DIAGNOSIS — Z6841 Body Mass Index (BMI) 40.0 and over, adult: Secondary | ICD-10-CM | POA: Insufficient documentation

## 2019-10-18 DIAGNOSIS — K2100 Gastro-esophageal reflux disease with esophagitis, without bleeding: Secondary | ICD-10-CM

## 2019-10-18 NOTE — Progress Notes (Signed)
Medical Weight Loss Management Visit   5 of 6    History of Present Illness:    HPI: MALI WALP is a 54 year old female with PMH of GERD, CT provenlarge hiatal hernia, morbid obesity who was recently seen in clinic by Dr. Windy Kalata and it was determined that pt will benefit fromlaparoscopic paraesophageal hernia repair and a roux-en-y gastric bypass surgery for weight loss vs a sleeve gastrectomy due to symptoms of GERD.    She presents to clinic today for medical weight loss consultation.    Today, pt reports she had chest pain issues and her PCP has referred her to cardiology.    Pt understands the need for lifestyle changes and achieve some degree of weigh loss prior to  surgery; in order to achieve this gaol, pt is not keen about taking weigh loss medications at this time.       Work up ordered:  UGI- 04/11/19 [facility])-5 cm sliding hiatalhernia  CT - 04/21/19 - Large Hiatal Hernia  EM- to be done  Psych Eval- to be done.   Sleep Study- to be done, pt had an appointment, she canceled due to [place]- watching presently       Long visits  #1 on 06/14/19- 265 lbs. HT 5'3, BMI 47  # 2 on 07/12/19- 264 lbs.  #3 on 08/10/19- 260 lbs.  #4 on 09/07/19-    258 lbs  #5 on 10/18/19-  257 lbs          Review of Systems:   Constitutional- Negative for chills or fever  Neurological- Negative for headaches  Musculoskeletal- Negative for malaise, negative for joint pain  Cardiovascular- Negative for chest pain  Respiratory- Negative for cough or shortness of breath  Gastrointestinal- Negative for nausea, vomiting, abdominal pain, diarrhea, constipation, hematochezia or melena  Genitourinary- Negative for dysuria  Other pertinents were discussed with patient and negative.    12 point Review of System is negative other than what is noted above.         Obesity related Comorbidities:   OSA: yes  Hyperlipidemia- yes  Diabetes: no  HTN: yes  Degenerative Arthritis: yes  Venous Stasis disease: no  GERD:  yes, taking Pantoprazole 40 mg once a day, Carafate once a day and upto 4 to 6 Rolaids to manage symptons       Diet: 24 hr recall:   Breakfast- coffee and once slice of toast   Snack: none  Lunch: beef soup  Dinner: cooked chicken with broccoli and rice.  Evening snack:  none  Beverages: coffee, water    Exercise: walking 25 mins, 2 to 3 times a week    Hydration: 64 oz.      Past Medical Hx:  Past Medical History:   Diagnosis Date   . Anemia    . Congenital kidney disease    . Coronary artery disease    . Fibroids    . Gastritis    . GI bleed    . Hypertension    . Hypothyroidism        Past Surgical Hx:  Past Surgical History:   Procedure Laterality Date   . CESAREAN SECTION, LOW TRANSVERSE     . ENDOMETRIAL ABLATION      x2   . TUBAL LIGATION      at time of C-section       Social Hx:  Social History     Socioeconomic History   . Marital  status: Divorced     Spouse name: Not on file   . Number of children: Not on file   . Years of education: Not on file   . Highest education level: Not on file   Occupational History   . Not on file   Social Needs   . Financial resource strain: Not on file   . Food insecurity     Worry: Not on file     Inability: Not on file   . Transportation needs     Medical: Not on file     Non-medical: Not on file   Tobacco Use   . Smoking status: Never Smoker   . Smokeless tobacco: Never Used   Substance and Sexual Activity   . Alcohol use: Never     Frequency: Never   . Drug use: Never   . Sexual activity: Yes     Partners: Male   Lifestyle   . Physical activity     Days per week: Not on file     Minutes per session: Not on file   . Stress: Not on file   Relationships   . Social Product manager on phone: Not on file     Gets together: Not on file     Attends religious service: Not on file     Active member of club or organization: Not on file     Attends meetings of clubs or organizations: Not on file     Relationship status: Not on file   . Intimate partner violence     Fear of  current or ex partner: Not on file     Emotionally abused: Not on file     Physically abused: Not on file     Forced sexual activity: Not on file   Other Topics Concern   . Not on file   Social History Narrative    Lives in [place], in an apartment, lives with 2 daughters and boyfriend. Feels safe.    Denies EtOH, tobacco, illicit drug use    Denies h/o assault or abuse       Family Hx:  Family History   Problem Relation Name Age of Onset   . Hypertension Mother     . Osteoporosis Mother     . Stroke Mother     . No Known Problems Sister     . No Known Problems Brother         Medications:  Current Outpatient Medications on File Prior to Visit   Medication Sig Dispense Refill   . ACETAMINOPHEN EXTRA STRENGTH 500 MG tablet TAKE 1 TABLET BY MOUTH EVERY 4 TO 6 HOURS AS NEEDED     . acetaminophen-codeine (TYLENOL #3) 300-30 MG tablet TK 1/2 - 1 T PO Q 6 H PRN P     . acyclovir (ZOVIRAX) 400 MG tablet Take 400 mg by mouth 3 times daily.     Marland Kitchen albuterol 108 (90 Base) MCG/ACT inhaler INHALE 2 PUFFS PO Q 4 H FOR 90 DAYS     . amlodipine-benazepril (LOTREL) 10-40 MG capsule TK ONE C PO QD     . atorvastatin (LIPITOR) 40 MG tablet TK 1 T PO QD     . Blood Pressure Monitor/Arm DEVI U UTD     . famotidine (PEPCID) 20 MG tablet TK 1 T PO QHS PRN     . FLOVENT HFA 110 MCG/ACT inhaler INL 2 PFS PO BID     .  folic acid (FOLVITE) 1 MG tablet Take 1 mg by mouth daily.     . furosemide (LASIX) 40 MG tablet TK 1 T PO  D PRN     . HYDROcodone-acetaminophen (NORCO) 5-325 MG tablet TK 1 T PO Q 6 H PRN SEVERE PAIN     . levothyroxine (SYNTHROID) 125 MCG tablet Take 125 mcg by mouth every morning (before breakfast).     Marland Kitchen loratadine (CLARITIN) 10 MG tablet Take 10 mg by mouth daily.     . metoprolol tartrate (LOPRESSOR) 25 MG tablet Take 25 mg by mouth 2 times daily.     . ondansetron (ZOFRAN ODT) 4 MG disintegrating tablet DIS 1 T ON THE TONGUE Q 6 H PRF NAUSEA OR VOM     . pantoprazole (PROTONIX) 40 MG tablet TK 1 T PO DAILY     .  predniSONE (DELTASONE) 20 MG tablet Take 20 mg by mouth 2 times daily.     . traZODone (DESYREL) 50 MG tablet Take 50 mg by mouth at bedtime.     Marland Kitchen venlafaxine (EFFEXOR XR) 37.5 MG XR capsule TK 1 C PO QD WF     . VITAMIN D3 25 MCG (1000 UT) capsule TK 1 C PO BID       No current facility-administered medications on file prior to visit.        Allergies:  No Known Allergies          Physical Exam:  Ht 5\' 3"  (1.6 m)   Wt 116.6 kg (257 lb)   BMI 45.53 kg/m   General:    Alert and oriented, in NAD    Psych: Cooperative mood and affect    Assessment  ANESSIA JAFFE is a 54 year old female with PMH of GERD, CT provenlarge hiatal hernia, morbid obesity who presents for medical weight loss.    -8 lbs of weigh loss.  -Discussed that beyond weight control, there are metabolic benefits from exercising which include improved glycemic control, improvement in blood pressure, decreased risk for developing or progression of diabetes, and improvement in mood.     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   -Eat slowly. Savor the smell and texture of your food. In order to slow down eating, instead of a fork, use chopsticks or non-dominant hand, stop eating at or before the point of fullness.    -Discussed that attendance at support and Info sessions are highly encouraged to improve outcomes.    Plan   F/U in 1 month   You tube chair exercises  Improve on quality of sleep  Practice mindful eating- do not eat while watching TV  Continue to eat lean protein, more non-starchy vegetables and drink plenty of fluids  RTC in 4 weeks.    Due to COVID-19 pandemic and a federally declared state of public health emergency, this telemedicine visit was conducted Audio Only. Total time spent was 21+ minutes.

## 2019-10-18 NOTE — Interdisciplinary (Signed)
Patient states, hernia pain is all the time, taking pain medications. Experiencing heart pain but per patient already spoke with her pcp.

## 2019-10-18 NOTE — Patient Instructions (Signed)
Plan   F/U in 1 month   You tube chair exercises  Improve on quality of sleep  Practice mindful eating- do not eat while watching TV  Continue to eat lean protein, more non-starchy vegetables and drink plenty of fluids  RTC in 4 weeks.

## 2019-10-24 ENCOUNTER — Telehealth: Payer: Self-pay | Admitting: Surgery

## 2019-10-24 NOTE — Telephone Encounter (Signed)
Patient stated that the doctor she was sent to for the psych eval doesn't accept her insurance    Please call with new referral    Thank you

## 2019-10-25 NOTE — Telephone Encounter (Signed)
Patient would to review test / procedures she suppose to get done with nurse. Please call to assist, thank you.

## 2019-10-26 ENCOUNTER — Telehealth: Payer: Self-pay

## 2019-10-26 NOTE — Telephone Encounter (Signed)
Called patient to confirm if she has made appointments for pending studies, per patient    Psych evaluation- called and per patient was told no appointments available.    Sleep study- has an appointment next Wednesday in [place].    Emmi videos- per patient completed some    UGI- approved at [facility] [place], [place] [address], gave patient phone # [phone]       Also, reminded patient of upcoming appointment with Varsha N.P.

## 2019-11-02 ENCOUNTER — Telehealth: Payer: Self-pay

## 2019-11-02 NOTE — Telephone Encounter (Signed)
Left her a message that her Psyc evaluation was aprroved at:    [facility]   [place]  Phone # [phone]

## 2019-11-07 NOTE — Telephone Encounter (Signed)
Left message again asking to please give info on what is needed and to provide fax number as to where I can fax information that may be helpful

## 2019-11-07 NOTE — Telephone Encounter (Signed)
Brandi Hart returning call back to nurse Renata.   See message below.   Please call to assist, thnak you.

## 2019-11-07 NOTE — Telephone Encounter (Signed)
behavior health manager Elsworth Soho from [place] needs to clarify/ go over what patient needs to get done prior to hernia surgery. Please call to assist, thank you.

## 2019-11-07 NOTE — Telephone Encounter (Signed)
Left message

## 2019-11-08 ENCOUNTER — Telehealth: Payer: Self-pay | Admitting: Surgery

## 2019-11-08 NOTE — Telephone Encounter (Signed)
Alba at [facility] stated that the patient is having difficulty scheduling with the psychiatrist, asking if you can re submit the request for a different one    Also that patient is telling them that the doctor order an endoscopy    Please call her or fax, needs to know what patient needs to get done prior surgery    Fax [phone]    Thank you

## 2019-11-11 ENCOUNTER — Telehealth: Payer: Self-pay

## 2019-11-11 NOTE — Telephone Encounter (Signed)
Reviewed with patient: called patient unable complete intake      - Confirmed appt day/time/visit type. MyChart customer service 1 [phone]    - Per patient Ht: Wt:255 LBS    - We're a teaching hospital so may see/talk to a resident, fellow or medical student prior to attending doctor    - Lab preference    - Smoking/tobacco status    - Care team (PCP, referring MD and our provider entered)    - Travel/Falls screening    - Allergies    - Medications/preferred pharmacy    - Interpreter, if needed:SPANISH    Reminded to fax recent labs, reports or imaging records to clinic for follow up visits at [phone] attn MD name     New patient records fax  to [phone]    CD's can be dropped off or mailed to [facility] 3rd floor    Notified patient that if provider sends any lab work for patient, it will be sent electronically to lab; prescriptions electronically sent to preferred pharmacy and after visit summary will be sent via MyChart portal or mailed for patients without MyChart. Patient understood, no further questions.

## 2019-11-15 ENCOUNTER — Encounter: Payer: Self-pay | Admitting: Nurse Practitioner

## 2019-11-15 ENCOUNTER — Ambulatory Visit: Payer: Medicaid Other | Attending: Nurse Practitioner | Admitting: Nurse Practitioner

## 2019-11-15 DIAGNOSIS — Z6841 Body Mass Index (BMI) 40.0 and over, adult: Secondary | ICD-10-CM | POA: Insufficient documentation

## 2019-11-15 DIAGNOSIS — K449 Diaphragmatic hernia without obstruction or gangrene: Secondary | ICD-10-CM | POA: Insufficient documentation

## 2019-11-15 DIAGNOSIS — K2100 Gastro-esophageal reflux disease with esophagitis, without bleeding: Secondary | ICD-10-CM | POA: Insufficient documentation

## 2019-11-15 NOTE — Progress Notes (Signed)
Medical Weight Loss Management Visit    6 of 6    History of Present Illness:    HPI: Brandi Hart is a 54 year old female with PMH of GERD, CT provenlarge hiatal hernia, morbid obesity who was recently seen in clinic by Dr. Windy Kalata and it was determined that pt will benefit fromlaparoscopic paraesophageal hernia repair and a roux-en-y gastric bypass surgery for weight loss vs a sleeve gastrectomy due to symptoms of GERD.    She presents to clinic today for medical weight loss consultation.      Pt understands the need for lifestyle changes and achieve some degree of weigh loss prior to  surgery; in order to achieve this gaol, pt is not keen about taking weigh loss medications at this time.     Pt reports some issues with her insurance and has not been able to schedule  Some of her pre operative work up so far.       Work up so far:  UGI- 04/11/19 [facility])-5 cm sliding hiatalhernia  CT - 04/21/19 - Large Hiatal Hernia  EM- to be done  Psych Eval- to be done.   Sleep Study- to be done on 11/18/19  EMMY- finished      MWL visits  #1 on 06/14/19- 265 lbs. HT 5'3, BMI 47  # 2 on 07/12/19- 264 lbs.  #3 on 08/10/19- 260 lbs.  #4 on 09/07/19-258 lbs  #5 on 10/18/19-  257 lbs   #6 on 11/15/19-  254 lbs.   Total weight loss - 9 lbs.           Review of Systems:   Constitutional- Negative for chills or fever  Neurological- Negative for headaches  Musculoskeletal- Negative for malaise, negative for joint pain  Cardiovascular- Negative for chest pain  Respiratory- Negative for cough or shortness of breath  Gastrointestinal- Negative for nausea, vomiting, abdominal pain, diarrhea, constipation, hematochezia or melena  Genitourinary- Negative for dysuria  Other pertinents were discussed with patient and negative.    12 point Review of System is negative other than what is noted above.         Obesity related Comorbidities:   OSA: yes  Hyperlipidemia- yes  Diabetes: no  HTN: yes  Degenerative Arthritis:  yes  Venous Stasis disease: no  GERD: yes, taking Pantoprazole 40 mg once a day, Carafate once a day and upto 4 to 6 Rolaids to manage symptons     Diet: 24 hr recall:   Breakfast- coffee and toast  Snack: apple  Lunch: Beef soup  Dinner: tea  Evening snack: mango  Beverages: water      Exercise: walking 25 mins med pace.     Hydration: 75 oz     Past Medical Hx:  Past Medical History:   Diagnosis Date   . Anemia    . Congenital kidney disease    . Coronary artery disease    . Fibroids    . Gastritis    . GI bleed    . Hypertension    . Hypothyroidism        Past Surgical Hx:  Past Surgical History:   Procedure Laterality Date   . CESAREAN SECTION, LOW TRANSVERSE     . ENDOMETRIAL ABLATION      x2   . TUBAL LIGATION      at time of C-section       Social Hx:  Social History     Socioeconomic History   .  Marital status: Divorced     Spouse name: Not on file   . Number of children: Not on file   . Years of education: Not on file   . Highest education level: Not on file   Occupational History   . Not on file   Social Needs   . Financial resource strain: Not on file   . Food insecurity     Worry: Not on file     Inability: Not on file   . Transportation needs     Medical: Not on file     Non-medical: Not on file   Tobacco Use   . Smoking status: Never Smoker   . Smokeless tobacco: Never Used   Substance and Sexual Activity   . Alcohol use: Never     Frequency: Never   . Drug use: Never   . Sexual activity: Yes     Partners: Male   Lifestyle   . Physical activity     Days per week: Not on file     Minutes per session: Not on file   . Stress: Not on file   Relationships   . Social Product manager on phone: Not on file     Gets together: Not on file     Attends religious service: Not on file     Active member of club or organization: Not on file     Attends meetings of clubs or organizations: Not on file     Relationship status: Not on file   . Intimate partner violence     Fear of current or ex partner: Not on file       Emotionally abused: Not on file     Physically abused: Not on file     Forced sexual activity: Not on file   Other Topics Concern   . Not on file   Social History Narrative    Lives in [place], in an apartment, lives with 2 daughters and boyfriend. Feels safe.    Denies EtOH, tobacco, illicit drug use    Denies h/o assault or abuse       Family Hx:  Family History   Problem Relation Name Age of Onset   . Hypertension Mother     . Osteoporosis Mother     . Stroke Mother     . No Known Problems Sister     . No Known Problems Brother         Medications:  Current Outpatient Medications on File Prior to Visit   Medication Sig Dispense Refill   . ACETAMINOPHEN EXTRA STRENGTH 500 MG tablet TAKE 1 TABLET BY MOUTH EVERY 4 TO 6 HOURS AS NEEDED     . acetaminophen-codeine (TYLENOL #3) 300-30 MG tablet TK 1/2 - 1 T PO Q 6 H PRN P     . acyclovir (ZOVIRAX) 400 MG tablet Take 400 mg by mouth 3 times daily.     Marland Kitchen albuterol 108 (90 Base) MCG/ACT inhaler INHALE 2 PUFFS PO Q 4 H FOR 90 DAYS     . amlodipine-benazepril (LOTREL) 10-40 MG capsule TK ONE C PO QD     . atorvastatin (LIPITOR) 40 MG tablet TK 1 T PO QD     . Blood Pressure Monitor/Arm DEVI U UTD     . famotidine (PEPCID) 20 MG tablet TK 1 T PO QHS PRN     . FLOVENT HFA 110 MCG/ACT inhaler INL 2 PFS PO BID     .  folic acid (FOLVITE) 1 MG tablet Take 1 mg by mouth daily.     . furosemide (LASIX) 40 MG tablet TK 1 T PO  D PRN     . HYDROcodone-acetaminophen (NORCO) 5-325 MG tablet Take 1 tablet by mouth every 6 hours as needed for Moderate Pain (Pain Score 4-6).      Marland Kitchen levothyroxine (SYNTHROID) 125 MCG tablet Take 125 mcg by mouth every morning (before breakfast).     Marland Kitchen loratadine (CLARITIN) 10 MG tablet Take 10 mg by mouth as needed (ALLERGIES).      Marland Kitchen metoprolol tartrate (LOPRESSOR) 25 MG tablet Take 25 mg by mouth 2 times daily.     . ondansetron (ZOFRAN ODT) 4 MG disintegrating tablet Take 4 mg by mouth every 6 hours as needed for Nausea/Vomiting.      .  pantoprazole (PROTONIX) 40 MG tablet TK 1 T PO DAILY     . predniSONE (DELTASONE) 20 MG tablet Take 20 mg by mouth 2 times daily.     . traZODone (DESYREL) 50 MG tablet Take 50 mg by mouth at bedtime.     Marland Kitchen venlafaxine (EFFEXOR XR) 37.5 MG XR capsule Take 37.5 mg by mouth daily.      Marland Kitchen VITAMIN D3 25 MCG (1000 UT) capsule Take 1 capsule by mouth daily.        No current facility-administered medications on file prior to visit.        Allergies:  No Known Allergies          Physical Exam:  There were no vitals taken for this visit.  General:    Alert and oriented, in NAD  Psych: Cooperative mood and affect      Relevant workup to date is summarized below:    04/21/2019: CT Abdomen w/ and w/o Contrast [facility])  FINDINGS:   CT abdomen: Lung bases clear no pleural or pericardial fluid. Large hiatal hernia is identified.  Liver gallbladder biliary tree pancreas and spleen are normal. No adrenal masses. 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 No free fluid or free air. There is no  retroperitoneal or mesenteric lymphadenopathy. There is calcification the abdominal aorta without aneurysm.  Small umbilical hernia contains fat. Mild degenerative changes lumbar spine. There is diverticulosis of the  proximal sigmoid colon without diverticulitis    IMPRESSION:   Large hiatal hernia  Horseshoe type kidney    04/11/2011: Upper GI [facility])  FINDINGS:   Fluoroscopic and film examination demonstrate no obstructions within the esophagus. There is  approximately 5 cm hiatus hernia of the stomach associated with severe gastroesophageal reflux. The stomach  otherwise, fills normally with contrast and it is free from ulcerations, filling defects, irregularities or abnormal  extrinsic compression. The gastric rugal folds are in the normal range and normal gastric peristaltic activity.  The duodenal bulb, loop, and the visualized proximal  small bowel are within normal limits.    IMPRESSION:  1. Approximately 5 cm sliding hiatus hernia of the stomach associated with severe gastroesophageal reflux.  2. No demonstrable ulcer crater, obstruction or other abnormality on the current study.        Assessment  DALIYA SUMI is a 54 year old female with PMH of GERD, CT provenlarge hiatal hernia, morbid obesity who was recently seen in clinic by Dr. Windy Kalata and it was determined that pt will benefit fromlaparoscopic paraesophageal hernia repair and a roux-en-y gastric  bypass surgery for weight loss vs a sleeve gastrectomy due to symptoms of GERD and here for [facility] visit.     -Discussed that bariatric surgery requires a commitment to permanent change in lifestyle   -Discussed the patients current diet with her.   -Reviewed portion sizes, dietary guidelines, and to decrease carb intake.   -Encouraged regular exercise through combination of aerobic/resistance/weight training.   -Discussed a deck of cards sized chicken/meat is about  21 gms of protein.   -Patient understands the need to take a multivitamin with iron; calcium, vitamin D and vitamin B12 supplementation for the rest of their life after sleeve gastrectomy  -Eat slowly. Savor the smell and texture of your food. In order to slow down eating, instead of a fork, use chopsticks or non-dominant hand, stop eating at or before the point of fullness.    -The patient understands that following this type of surgery, weight loss can only be achieved and maintained through diligent attention to a balanced dietary regimen and physical activity.    -The patient was encouraged to increase physical activity in an effort to increase level of fitness as much as possible prior to surgery.      Plan   Continue with daily excercie  Practice mindful eating- do not eat while watching TV, driving or playing with your phone, this will prevent you from fully enjoying your food, be less satisfied with it and may continue  to eat even when full.  Use timer or non dominant hand or chop sticks to prolong eating meals  Journal daily- weight, foods and drinks and exercise and bring log to next clinic visit.  Continue to eat lean protein, more non-starchy vegetables and drink plenty of fluids  RTC after all work up is completed to see Dr. Retta Diones.       Due to COVID-19 pandemic and a federally declared state of public health emergency, this telemedicine visit was conducted Audio Only. Total time spent was 21+ minutes.

## 2019-11-15 NOTE — Patient Instructions (Signed)
Plan   Continue with daily excercie  Practice mindful eating- do not eat while watching TV, driving or playing with your phone, this will prevent you from fully enjoying your food, be less satisfied with it and may continue to eat even when full.  Use timer or non dominant hand or chop sticks to prolong eating meals  Journal daily- weight, foods and drinks and exercise and bring log to next clinic visit.  Continue to eat lean protein, more non-starchy vegetables and drink plenty of fluids  RTC after all work up is completed to see Dr. Retta Diones.

## 2019-11-15 NOTE — Interdisciplinary (Signed)
AVS mailed out.

## 2019-11-16 ENCOUNTER — Telehealth: Payer: Self-pay | Admitting: Surgery

## 2019-11-16 NOTE — Telephone Encounter (Signed)
Brandi Hart,       I am so sorry -   Can you call her and see if she has an appt at below for her Upper GI and see if she ever got ahold of the Psychiatry Eval?       The Upper GI is to be done at and has been faxed to them twice

## 2019-11-17 ENCOUNTER — Encounter: Payer: Self-pay | Admitting: Gastroenterology

## 2019-11-17 ENCOUNTER — Telehealth: Payer: Self-pay

## 2019-11-17 NOTE — Telephone Encounter (Signed)
Left patient a message if she schedule her psychiatry evaluation and her Upper GI.

## 2019-12-08 ENCOUNTER — Telehealth: Payer: Self-pay

## 2019-12-08 NOTE — Telephone Encounter (Signed)
[facility] Bariatric Consult     Pt is a 54 y/o Latina female who presented to clinic for her initial bariatric evaluation on 05/10/19 with Dr. Retta Diones. CSW conducted own telephone assessment on 05/11/19. CSW reviewed pt's chart and determined pt's psychological evaluation has not been completed and EMMI videos have not been viewed and completed (expired May 31,2021). CSW acknowledge pt has a follow up appointment with Dr. Retta Diones scheduled for December 27, 2019. There have been prior attempts to reach pt to request follow up with bariatric requirements.     CSW attempted to contact pt to address the missing bariatric requirements. CSW was unable to reach pt but left her a detailed message to discuss whether she has scheduled her appointments or still interested in pursuing bariatric surgery. CSW explained bariatric requirements missing and requested a call back to further guide pt. CSW provided with contact information and agreed to remain available. CSW updated care team. Darrold Junker, LCSW [phone]

## 2019-12-09 ENCOUNTER — Telehealth: Payer: Self-pay

## 2019-12-09 ENCOUNTER — Ambulatory Visit (INDEPENDENT_AMBULATORY_CARE_PROVIDER_SITE_OTHER): Payer: Medicaid Other

## 2019-12-09 DIAGNOSIS — N939 Abnormal uterine and vaginal bleeding, unspecified: Secondary | ICD-10-CM

## 2019-12-09 MED ORDER — MEDROXYPROGESTERONE ACETATE 150 MG/ML IM SUSY
150.0000 mg | PREFILLED_SYRINGE | Freq: Once | INTRAMUSCULAR | Status: AC
Start: 2019-12-09 — End: 2019-12-09
  Administered 2019-12-09: 150 mg via INTRAMUSCULAR

## 2019-12-09 NOTE — Telephone Encounter (Signed)
Called patient to inform does not need to perform the EGD/Bravo that is scheduled, patient already has an EGD with [facility] last year, patient understood ok to cancel.     Also patient stated the psych eval that she has been referred to # is DC,     Brandi Hart  Phone # [phone] and [phone], I also called to make sure # are disconnected and googled, numbers are not in service, also patient states emmi videos have been completed. I told patient I will talk with the auth coordinator and SW then call her back. Patient understood.

## 2019-12-09 NOTE — Patient Instructions (Signed)
Schedule next injection starting Ausgust 20 thru Sept 3, 2021

## 2019-12-09 NOTE — Interdisciplinary (Signed)
Depo Provera 150mg  per mL for having AUB per Dr. Celene Skeen orders. Depo is administered on the left arm patient's request. It is administered intramuscular, well tolerated.    [place]: [address]-5   Exp. 08/07/2023  Lot: [id number]    Patient to come back from 8/20 thru 9/3 for next injection.

## 2019-12-12 NOTE — Telephone Encounter (Signed)
Per request from Vladimir Creeks -   I cancelled pt's EGD/Bravo

## 2019-12-13 ENCOUNTER — Telehealth: Payer: Self-pay

## 2019-12-13 NOTE — Telephone Encounter (Signed)
[facility] Bariatric Consult     CSW received notification that pt had attempted to schedule her psychological evaluation but unable to reach psychologist. CSW also attempted to contact psychologist, Nicolasa Ducking and appears one number disconnected and other unable to reach anyone. Pt claims to have also viewed EMMI videos.     CSW contacted pt. CSW acknowledged receiving notification from [place] who spoke to pt regarding bariatric requirements. CSW indicates also trying to reach out to Mr. Rolena Infante with numbers listed. Pt reports leaving her name on one number but never getting a call back. Pt explains she was given this psychologist name by bariatric team. CSW provided pt with list of psychologists in the bariatric form and emailed her a copy. CSW suggested pt attempt to schedule with these psychologists or go through her insurance provider. CSW also explained EMMI videos were not viewed given CSW receives notification when they have been. Pt's videos expired on May 2021 and agreed to reassign them to her email provided. CSW reassigned EMMI videos and explained how to locate the email with the link. CSW agreed to remain available for arising needs. CSW updated care team. Darrold Junker, LCSW [phone]

## 2019-12-14 ENCOUNTER — Telehealth: Payer: Self-pay

## 2019-12-14 NOTE — Telephone Encounter (Signed)
CSW received message from pt that she had completed her EMMI videos. CSW verified pt had viewed and completed all videos. Pt is still working on obtaining her psychological evaluation. CSW agreed to remain available for arising needs. Darrold Junker, [place]

## 2019-12-15 ENCOUNTER — Telehealth: Payer: Self-pay

## 2019-12-15 NOTE — Telephone Encounter (Signed)
Opened in error

## 2019-12-23 ENCOUNTER — Telehealth: Payer: Self-pay

## 2019-12-23 NOTE — Telephone Encounter (Signed)
Left message to call me back , need to find out if the [facility] GI is done and also the Phsyc evaluation.

## 2019-12-27 ENCOUNTER — Ambulatory Visit: Payer: Medicaid Other | Admitting: Surgery

## 2019-12-28 ENCOUNTER — Ambulatory Visit: Admission: RE | Admit: 2019-12-28 | Payer: Medicaid Other | Admitting: Gastroenterology

## 2019-12-28 ENCOUNTER — Encounter: Admission: RE | Payer: Self-pay

## 2019-12-28 SURGERY — WIRELESS BRAVO STUDY
Anesthesia: General

## 2020-01-03 ENCOUNTER — Ambulatory Visit: Payer: Medicaid Other | Admitting: Surgery

## 2020-01-04 ENCOUNTER — Telehealth: Payer: Self-pay

## 2020-01-04 NOTE — Telephone Encounter (Signed)
Spoke with patient to schedule her upper GI at [facility] [phone].

## 2020-01-17 ENCOUNTER — Ambulatory Visit: Payer: Medicaid Other | Admitting: Surgery

## 2020-02-16 ENCOUNTER — Telehealth: Payer: Self-pay

## 2020-02-16 NOTE — Telephone Encounter (Signed)
Called patient to ask if she has completed her psych evaluation, per patient completed. Patient provided information.    Brandi Baldy, NP at [place], [place] [address]    Phone # [phone]    Fax # [phone]- attn perla    Called Dr. Burnis Kingfisher, [facility] request release of records to be faxed.

## 2020-02-21 ENCOUNTER — Ambulatory Visit: Payer: Medicaid Other | Attending: Surgery | Admitting: Surgery

## 2020-02-21 ENCOUNTER — Encounter: Payer: Self-pay | Admitting: Surgery

## 2020-02-21 VITALS — BP 114/71 | HR 70 | Temp 98.4°F | Resp 18 | Ht 63.0 in | Wt 245.2 lb

## 2020-02-21 DIAGNOSIS — K449 Diaphragmatic hernia without obstruction or gangrene: Secondary | ICD-10-CM | POA: Insufficient documentation

## 2020-02-21 DIAGNOSIS — Z6841 Body Mass Index (BMI) 40.0 and over, adult: Secondary | ICD-10-CM

## 2020-02-21 DIAGNOSIS — K2100 Gastro-esophageal reflux disease with esophagitis, without bleeding: Secondary | ICD-10-CM | POA: Insufficient documentation

## 2020-02-21 NOTE — Patient Instructions (Signed)
1. We will submit for authorization   2. Once authorization has been obtained you will receive a call to schedule the pre operative appointment  3. Please obtain Medical Clearance from a Primary Care Provider and EKG  4. Please have Vitamin labs done: Fasting     Please call Suan Halter, [place] if you have any questions or concerns. Please fax records or any applicable documents to [phone].    If you need to schedule or reschedule appointment:      [phone]      If you have not heard anything regarding your authorizations within 2 weeks, please call our  surgery scheduler, referral/ authorization coordinator                      [place]: [phone]

## 2020-02-21 NOTE — Progress Notes (Signed)
BARIATRIC SURGERY TEST REVIEW     IDENTIFICATION/REASON FOR VISIT  A 54 year old female returns to review tests and consultations ordered by Dr. Retta Diones at the initial visit with this patient for consideration of bariatric surgery for treatment of morbid obesity complicated by multiple comorbidities.    There is no problem list on file for this patient.    HISTORY OF PRESENT ILLNESS   The patient was initially seen by to discuss bariatric surgery. The patient has made multiple non surgical attempts at weight loss over the last several years without success at achieving sustained, significant weight loss. Please see initial bariatric clinic visit documentation for details of the weight history.     TEST REVIEW:   The following tests and consultations were completed to determine the patient?s suitability for bariatric surgery, with results located in the [facility] record:     1. Patient attended or viewed online the [place] [facility] [place] bariatric patient education seminar.     2. The required laboratory tests (CBC, CMP, HgA1C, fasting Lipids, TSH, Vitamin D 25-OH) were performed and are available in the electronic medical record. Results were within acceptable limits, except for the following significant abnormals: albumin 3.1; ferritin 12;h/h 11.5/38.1 .     3. Nutritional assessment by our dietician noted that the patient was determined to be an acceptable candidate for bariatric surgery, and was encouraged to begin to practice eating slowly, taking small bites and chewing thoroughly, begin daily supplements (complete multivitamin/mineral, iron, calcium, Vitamin D) and to establish an exercise routine in preparation for surgery.     4. Psychosocial assessment for bariatric surgery was completed by our social worker and psych evaluation and the patient was determined to be an acceptable Bariatric surgical candidate from a psychosocial perspective.      Upper GI was completed and reviewed  and showed hiatal hernia.    egd       INTERVAL HISTORY: had some epigastric pain over weekend and back no improved. Loosing weight, exercising with walking.     Past Medical History:   Past Medical History:   Diagnosis Date    Anemia     Congenital kidney disease     Coronary artery disease     Fibroids     Gastritis     GI bleed     Hypertension     Hypothyroidism       Past Surgical History:   Past Surgical History:   Procedure Laterality Date    CESAREAN SECTION, LOW TRANSVERSE      ENDOMETRIAL ABLATION      x2    TUBAL LIGATION      at time of C-section      Family and Social History:  Family History   Problem Relation Name Age of Onset    Hypertension Mother      Osteoporosis Mother      Stroke Mother      No Known Problems Sister      No Known Problems Brother       Social History     Tobacco Use    Smoking status: Never Smoker    Smokeless tobacco: Never Used   Substance Use Topics    Alcohol use: Never     Active Meds:   Current Outpatient Medications   Medication Sig    ACETAMINOPHEN EXTRA STRENGTH 500 MG tablet TAKE 1 TABLET BY MOUTH EVERY 4 TO 6 HOURS AS NEEDED    acetaminophen-codeine (TYLENOL #  3) 300-30 MG tablet TK 1/2 - 1 T PO Q 6 H PRN P    acyclovir (ZOVIRAX) 400 MG tablet Take 400 mg by mouth 3 times daily.    albuterol 108 (90 Base) MCG/ACT inhaler INHALE 2 PUFFS PO Q 4 H FOR 90 DAYS    amlodipine-benazepril (LOTREL) 10-40 MG capsule TK ONE C PO QD    atorvastatin (LIPITOR) 40 MG tablet TK 1 T PO QD    Blood Pressure Monitor/Arm DEVI U UTD    famotidine (PEPCID) 20 MG tablet TK 1 T PO QHS PRN    FLOVENT HFA 110 MCG/ACT inhaler INL 2 PFS PO BID    folic acid (FOLVITE) 1 MG tablet Take 1 mg by mouth daily.    furosemide (LASIX) 40 MG tablet TK 1 T PO  D PRN    HYDROcodone-acetaminophen (NORCO) 5-325 MG tablet Take 1 tablet by mouth every 6 hours as needed for Moderate Pain (Pain Score 4-6).     levothyroxine (SYNTHROID) 125 MCG tablet Take 125 mcg by mouth every  morning (before breakfast).    loratadine (CLARITIN) 10 MG tablet Take 10 mg by mouth as needed (ALLERGIES).     metoprolol tartrate (LOPRESSOR) 25 MG tablet Take 25 mg by mouth 2 times daily.    ondansetron (ZOFRAN ODT) 4 MG disintegrating tablet Take 4 mg by mouth every 6 hours as needed for Nausea/Vomiting.     pantoprazole (PROTONIX) 40 MG tablet TK 1 T PO DAILY    predniSONE (DELTASONE) 20 MG tablet Take 20 mg by mouth 2 times daily.    traZODone (DESYREL) 50 MG tablet Take 50 mg by mouth at bedtime.    venlafaxine (EFFEXOR XR) 37.5 MG XR capsule Take 37.5 mg by mouth daily.     VITAMIN D3 25 MCG (1000 UT) capsule Take 1 capsule by mouth daily.      No current facility-administered medications for this visit.     Allergies:   No Known Allergies   REVIEW OF SYSTEMS:   Was normal except for the pertinent positive in the HPI.  A complete review of systems was conducted.    PHYSICAL EXAM:  BP 114/71    Pulse 70    Temp 98.4 F (36.9 C) (Oral)    Resp 18    Ht 5\' 3"  (1.6 m)    Wt 111.2 kg (245 lb 2.4 oz)    BMI 43.43 kg/m   Constitutional:             well nourished  Eyes:                           sclera anicteric  Head:                          normocephalic, atraumatic  Chest:                         clear bilaterally  Cardiac:                      normal rate, regular rhythm, no murmurs, rubs, clicks or gallops  Abdomen:                   abdomen is soft without significant tenderness    Extremities:  normal strength, tone, and muscle mass  Skin:                            no rashes or significant lesions    ASSESSMENT:  The patient is a 54 year old female with morbid obesity, Body mass index is 43.43 kg/m., and multiple medical comorbidities as in HPI. The patient wishes to proceed with bariatric surgery in order to assist with achieving significant and sustained weight loss which the patient has been unable to achieve with multiple nonsurgical weight loss attempts in the past. The  patient and has been evaluated/educated by our multidisciplinary team as documented in Test Review above, and all agree that the patient is an acceptable candidate for the specified surgery, and stands to benefit significantly in terms of future health, improvement in medical co-morbidities, and functional status by sustained, significant weight loss offered by Bariatric surgery. The patient understands that following this type of surgery, weight loss can only be achieved and maintained through diligent attention to a balanced dietary regimen and physical activity.     PLAN:  1. I reviewed with patient today again the potential risks, benefits and alternatives to gastric bypass and paraesophageal hernia repair surgery which were reviewed at length at the patient?s initial evaluation by the surgeon. Possible complications of the operation may include the following: complications from anesthesia, need to be on a ventilator for a prolonged period, bleeding, infection, death, pulmonary embolus, heart attack, stroke, anastomotic/staple line leak, intestinal obstruction, hernia formation, wound breakdown, the need for reoperation to correct these problems, the need for vitamin and mineral supplements for life to prevent nutritional deficiencies, and failure to lose the desired amount of weight. The patient wishes to proceed with surgery and signed a consent today for laparoscopic paraesophageal hernia repair and gastric bypass.     2. The patient was also seen today by the dietician, who reviewed the very low calorie diet which the patient is to follow for 3 weeks immediately prior to the surgery in order decrease liver size and help with preoperative weight loss.    3. The patient will be seen by the Bariatric nurse for pre-operative teaching today.     4. The patient's surgical packet will be forwarded to our bariatric patient care coordinator who will obtain insurance authorization and then will contact the patient to  schedule a date for the surgery. The patient will be scheduled a few weeks before surgery to return for a [place] [place] [facility] visit.    5. The patient was informed of the requirement for follow-up visits in clinic at 2 and 6 weeks postoperative, then 3, 6, 9 and 12 months after surgery, and then yearly. Patient understands the need to take a multivitamin with iron; calcium, vitamin D and vitamin B12 supplementation for the rest of their life after gastric bypass and sleeve gastrectomy.     6. The patient was encouraged to increase physical activity in an effort to increase level of fitness as much as possible prior to surgery.     7. The patient will be recovered on the general surgery unit unless ICU care is deemed necessary at the time of surgery.    8.  We will have to also repeat albumin as her albumin as 3.1.  We will obtain nutrition labs prior to surgery.

## 2020-02-22 ENCOUNTER — Telehealth: Payer: Self-pay

## 2020-02-22 NOTE — Telephone Encounter (Signed)
Spoke with patient to verify PCP info and letter faxed over as requested.

## 2020-02-22 NOTE — Telephone Encounter (Signed)
Pt asks for help as the office will not accept order form pt . Would you please send order for pt's cardiogram and clearance for upcoming procedure . Pt wants to schedule ASAP please assist further .    F. [phone]    thank you ,

## 2020-02-23 ENCOUNTER — Telehealth: Payer: Self-pay

## 2020-02-23 NOTE — Telephone Encounter (Signed)
2nd Bariatric Consult     Pt is a 54 year old y/o partnered with female Hispanic female presenting to clinic for their 2nd bariatric consult.  CSW reviewed pts chart and conferred with care team. CSW unable to verify complete psychological evaluation. Discussed with clinic administration who acknowledge difficulty obtaining from outside provider and they will request again. The patient has a copy and will also fax to clinic.    CSW met with pt. CSW acknowledged pt states she had psychological evaluation outside of [place].  However, CSW could not locate in Media section of the chart. CSW assessed how pt has been doing emotionally. Pt shared fifteen pounds lost since last appointment. Today she is c/o left flank pain but says she has pain medication for this.  CSW explained importance of commitment to this weight loss journey and demonstrating some weight loss by next clinic appointment by continuing to diet and walk daily for thirty minutes. Pt agreed and has intentions to continue walking daily. CSW praised pt and explained the benefits of exercise. CSW explained the importance of compliance on both diet and exercise pre and post-surgery in order to be successful with weight loss goals. CSW explained Authorization Coordinator will be submitting all records to insurance for authorization and waiting for the approval before scheduling for 3rd appointment. CSW explained Hotel manager, [place], will be calling pt back. Pt denied further questions at this time. CSW will remain available and follow next clinic visit.     Peri Jefferson, LCSW  [phone] or Tiger Text

## 2020-02-24 NOTE — Telephone Encounter (Signed)
Spoke with Meredith Pel and received Psych Evaluation via email.

## 2020-02-28 ENCOUNTER — Ambulatory Visit: Payer: Medicaid Other

## 2020-02-28 ENCOUNTER — Telehealth: Payer: Self-pay

## 2020-02-28 NOTE — Telephone Encounter (Signed)
Faxed authorization Per patient requested  to [facility] pre-op scheduler at [phone].

## 2020-02-28 NOTE — Progress Notes (Addendum)
VISIT TYPE: 2nd Bariatric/Pre-Op    This is a 54 year old female who presents to clinic today for 2nd bariatric visit/pre-op. RD consulted by Dr. Retta Diones.  RD provided nutrition education and discussed nutrition plan. Spanish interpreter [phone]    NUTRITION DIAGNOSIS  Food and nutrition related knowledge deficit related to no prior pre/post bariatric diet education as evidenced by need for RD visit.     NUTRITION INTERVENTION  Nutrition Education:  Person Taught: Patient  Readiness to learn: Expresses interest and gaining insight on information from today's session.   Education Needed/Provided: Pre-op diet; Recommended bariatric vitamins; Post Op diet progression   Outcome/Evaluation: Verbalizes understanding and was able to demonstrate comprehension on verification.   Nutrition Education Method: RD provided education via verbal instructions and written instructions.     Goals: Understanding of diet education    Nutrition Counseling:  Recommended Plan:   1. FU Post Op    Pt demonstrates good understanding and shows good motivation to comply.     NUTRITION MONITORING  Follow-up plan for ongoing self-management support:  Food/Dietary Intake, Fluid/Beverage Intake, Weight  Follow up at next Bariatric visit/PRN    Notes routed to referring provider via EMR

## 2020-02-28 NOTE — Interdisciplinary (Signed)
See RD note.

## 2020-03-02 LAB — COMPREHENSIVE METABOLIC PANEL, BLOOD
A/G Ratio: 0.8 — ABNORMAL LOW (ref 1.2–2.2)
ALT (SGPT): 12 IU/L (ref 0–32)
AST: 13 IU/L (ref 0–40)
Albumin: 3.1 g/dL — ABNORMAL LOW (ref 3.8–4.9)
Alkaline Phos: 83 IU/L (ref 48–121)
BUN/Creatinine Ratio: 19 (ref 9–23)
BUN: 14 mg/dL (ref 6–24)
Bilirubin, Total: 0.4 mg/dL (ref 0.0–1.2)
Calcium: 8.9 mg/dL (ref 8.7–10.2)
Carbon Dioxide: 22 mmol/L (ref 20–29)
Chloride: 105 mmol/L (ref 96–106)
Creatinine: 0.73 mg/dL (ref 0.57–1.00)
Globulin, Total: 3.7 g/dL (ref 1.5–4.5)
Glucose: 95 mg/dL (ref 65–99)
Potassium: 3.6 mmol/L (ref 3.5–5.2)
Protein, Total, Serum: 6.8 g/dL (ref 6.0–8.5)
Sodium: 142 mmol/L (ref 134–144)
eGFR If Africn Am: 108 mL/min/{1.73_m2} (ref >59)
eGFR If NonAfricn Am: 94 mL/min/{1.73_m2} (ref >59)

## 2020-03-02 LAB — INSULIN, RANDOM, BLOOD: Insulin: 22.1 u[IU]/mL (ref 2.6–24.9)

## 2020-03-02 LAB — CBC WITH DIFF, BLOOD
Baso (Absolute): 0 10*3/uL (ref 0.0–0.2)
Basos: 0 %
Eos (Absolute): 0.1 10*3/uL (ref 0.0–0.4)
Eos: 2 %
Hematocrit: 38.1 % (ref 34.0–46.6)
Hemoglobin: 11.5 g/dL (ref 11.1–15.9)
Immature Grans (Abs): 0 10*3/uL (ref 0.0–0.1)
Immature Granulocytes: 0 %
Lymphs (Absolute): 2.5 10*3/uL (ref 0.7–3.1)
Lymphs: 28 %
MCH: 24.7 pg — ABNORMAL LOW (ref 26.6–33.0)
MCHC: 30.2 g/dL — ABNORMAL LOW (ref 31.5–35.7)
MCV: 82 fL (ref 79–97)
Monocytes(Absolute): 0.6 10*3/uL (ref 0.1–0.9)
Monocytes: 7 %
Neutrophils (Absolute): 5.6 10*3/uL (ref 1.4–7.0)
Neutrophils: 63 %
Platelets: 301 10*3/uL (ref 150–450)
RBC: 4.66 x10E6/uL (ref 3.77–5.28)
RDW: 15.8 % — ABNORMAL HIGH (ref 11.7–15.4)
WBC: 8.9 10*3/uL (ref 3.4–10.8)

## 2020-03-02 LAB — LIPID(CHOL FRACT) PANEL, BLOOD
Cholesterol: 179 mg/dL (ref 100–199)
HDL Cholesterol: 46 mg/dL (ref >39)
LDL Chol CAL (NIH) - LABCORP: 115 mg/dL — ABNORMAL HIGH (ref 0–99)
Non-HDL Cholesterol: 133 mg/dL — ABNORMAL HIGH (ref 0–129)
Triglycerides: 101 mg/dL (ref 0–149)
VLDL Cholesterol CAL: 18 mg/dL (ref 5–40)

## 2020-03-02 LAB — GLYCOSYLATED HGB(A1C), BLOOD: Hemoglobin A1c: 5.3 % (ref 4.8–5.6)

## 2020-03-02 LAB — TSH, BLOOD: TSH: 0.384 u[IU]/mL — ABNORMAL LOW (ref 0.450–4.500)

## 2020-03-02 LAB — LIPOPROTEIN(A), BLOOD: Lipoprotein (a): 42.7 nmol/L (ref <75.0)

## 2020-03-02 LAB — IRON, BLOOD: Iron, Serum: 35 ug/dL (ref 27–159)

## 2020-03-02 LAB — VITAMIN B1, BLOOD: Vit. B1, Whole Blood: 118.3 nmol/L (ref 66.5–200.0)

## 2020-03-02 LAB — VITAMIN A, BLOOD: Vitamin A: 23.1 ug/dL (ref 20.1–62.0)

## 2020-03-02 LAB — VITAMIN D, 25-OH TOTAL: Vitamin D, 25-Hydroxy: 31.2 ng/mL (ref 30.0–100.0)

## 2020-03-02 LAB — FERRITIN, BLOOD: Ferritin: 12 ng/mL — ABNORMAL LOW (ref 15–150)

## 2020-03-02 LAB — VITAMIN B12, BLOOD: Vitamin B12: 464 pg/mL (ref 232–1245)

## 2020-03-02 LAB — COPPER, SERUM: Copper: 169 ug/dL — ABNORMAL HIGH (ref 80–158)

## 2020-03-07 ENCOUNTER — Telehealth: Payer: Self-pay | Admitting: Surgery

## 2020-03-07 DIAGNOSIS — Z6841 Body Mass Index (BMI) 40.0 and over, adult: Secondary | ICD-10-CM

## 2020-03-07 NOTE — Telephone Encounter (Signed)
Brandi Hart from [facility] needs EKG order to be faxed to [phone].   Please call for further assistance, thank you.

## 2020-03-07 NOTE — Telephone Encounter (Signed)
EKG order faxed as requested

## 2020-03-16 ENCOUNTER — Telehealth: Payer: Self-pay

## 2020-03-16 LAB — COMPREHENSIVE METABOLIC PANEL, BLOOD
A/G Ratio: 1.4 (ref 1.2–2.2)
ALT (SGPT): 10 IU/L (ref 0–32)
AST: 15 IU/L (ref 0–40)
Albumin: 3.9 g/dL (ref 3.8–4.9)
Alkaline Phos: 83 IU/L (ref 48–121)
BUN/Creatinine Ratio: 30 — ABNORMAL HIGH (ref 9–23)
BUN: 16 mg/dL (ref 6–24)
Bilirubin, Total: 0.3 mg/dL (ref 0.0–1.2)
Calcium: 9.1 mg/dL (ref 8.7–10.2)
Carbon Dioxide: 27 mmol/L (ref 20–29)
Chloride: 103 mmol/L (ref 96–106)
Creatinine: 0.54 mg/dL — ABNORMAL LOW (ref 0.57–1.00)
Globulin, Total: 2.8 g/dL (ref 1.5–4.5)
Glucose: 86 mg/dL (ref 65–99)
Potassium: 4.3 mmol/L (ref 3.5–5.2)
Protein, Total, Serum: 6.7 g/dL (ref 6.0–8.5)
Sodium: 142 mmol/L (ref 134–144)
eGFR If Africn Am: 124 mL/min/{1.73_m2} (ref >59)
eGFR If NonAfricn Am: 107 mL/min/{1.73_m2} (ref >59)

## 2020-03-16 LAB — C-REACTIVE PROTEIN, BLOOD: C-Reactive Protein, Quant: 9 mg/L (ref 0–10)

## 2020-03-16 LAB — PREALBUMIN (TRANSTHYRETIN), BLOOD: Prealbumin: 17 mg/dL (ref 10–36)

## 2020-03-16 NOTE — Telephone Encounter (Signed)
Per patient thought she had a depo shot. Please contact to clarify thank you

## 2020-03-19 ENCOUNTER — Telehealth: Payer: Self-pay

## 2020-03-19 NOTE — Telephone Encounter (Signed)
Patient called to schedule depo shot but was advised she needs to take a pregnancy test first. can you please clarify. Please contact. Thank you

## 2020-03-19 NOTE — Telephone Encounter (Signed)
Spoke with patient informed her when her last Depo shot was given also informed her she is late for the next one per patient will call us back to schedule.

## 2020-03-27 ENCOUNTER — Ambulatory Visit: Payer: Medicaid Other

## 2020-03-27 ENCOUNTER — Ambulatory Visit: Payer: Medicaid Other | Attending: Surgery | Admitting: Surgery

## 2020-03-27 DIAGNOSIS — K449 Diaphragmatic hernia without obstruction or gangrene: Secondary | ICD-10-CM | POA: Insufficient documentation

## 2020-03-27 DIAGNOSIS — Z6841 Body Mass Index (BMI) 40.0 and over, adult: Secondary | ICD-10-CM | POA: Insufficient documentation

## 2020-03-27 DIAGNOSIS — K2100 Gastro-esophageal reflux disease with esophagitis, without bleeding: Secondary | ICD-10-CM | POA: Insufficient documentation

## 2020-03-27 NOTE — Progress Notes (Signed)
BARIATRIC SURGERY PREOPERATIVE VISIT    HISTORY OF PRESENT ILLNESS   Brandi Hart is a 54 yo female presenting for preoperative visit for laparoscopic paraesophageal hernia repair, Roux en Y gastric bypass, and Upper GI. The patient was initially seen to discuss bariatric surgery. The patient has made multiple non surgical attempts at weight loss over the last several years without success at achieving sustained, significant weight loss. Please see initial bariatric clinic visit documentation for details of the weight history.     TEST REVIEW:   The following tests and consultations were completed to determine the patient?s suitability for bariatric surgery, with results located in the [facility] record:     1. Patient attended or viewed online the [place] [facility] [place] bariatric patient education seminar.     2. The required laboratory tests (CBC, CMP, HgA1C, fasting Lipids, TSH, Vitamin D 25-OH) were performed and are available in the electronic medical record. Results were within acceptable limits, except for the following significant abnormals: ferritin 12;h/h 11.5/38.1 .     3. Nutritional assessment by our dietician noted that the patient was determined to be an acceptable candidate for bariatric surgery, and was encouraged to begin to practice eating slowly, taking small bites and chewing thoroughly, begin daily supplements (complete multivitamin/mineral, iron, calcium, Vitamin D) and to establish an exercise routine in preparation for surgery.     4. Psychosocial assessment for bariatric surgery was completed byour social worker and psych evaluation and the patient was determined to be an acceptable Bariatric surgical candidate from a psychosocial perspective.      Upper GI was completed and reviewed and showed 6 cm hiatal hernia.  -The Z line was irregular and was found 34 cm from the incisors. A 6 cm hiatal hernia was found. The proximal extent of the gastric folds (end of  tubular esophagus) was 36 cm from the incisors. The hiatal narrowing was 40 cm from the incisors The Z line was 34 cm from the incisors.      Interval history:  Patient states that she will be getting her preoperative ECHO in one week. She additionally notes that she has been having extensive vaginal bleeding secondary to fibroids. She has an appointment to see gynecology in two weeks in order to discuss hysterectomy. She continues to endorse symptoms of GERD that range from moderate to severe.    Obesity related Comorbidities:  OSA:yes  Hyperlipidemia: yes  Diabetes:no  HTN:yes  Degenerative Arthritis:yes  Venous Stasis disease:no  GERD:yes     Vitals:    03/27/20 1318   BP: 127/62   Pulse: 74   Resp: 18   Temp: 98.1 F (36.7 C)   Weight: 112 kg (246 lb 14.6 oz)   Height: 5\' 3"  (1.6 m)     Physical Exam:  General: No acute distress, well-developed, obese   Neuro: GCS15, Alert and Oriented x4, moving all 4  HEENT: Normocephalic and atraumatic. Sclera anicteric.    Cardio: Regular rate and rhythm   Resp: Normal respiratory effort on room air   Abdomen: Soft, non-tender, non-distended, no guarding, no focal tenderness, non-peritonitic, non-tympanitic.   Extremities: Warm and well perfused, no edema, no cyanosis     WBC 8.9 (08/23) HGB 11.5 (08/23) PLT 301 (08/23)    HCT 38.1 (08/23)      Na   CL 103 (09/09) BUN 16 (09/09) GLU   86 (09/09)   K 4.3 (09/09) CO2   Cr 0.54* (09/09)  A1c 5.3 (08/23)    [place] 9.1 (09/09) Mg   Phos      Albumin (03/15/20) 3.9    Assessment/Plan:   Brandi Hart is a 54 yo female presenting for preoperative visit for laparoscopic paraesophageal hernia repair, Roux en Y gastric bypass, and Upper GI. Patient has completed all necessary preoperative testing aside from ECHO which will be completed in 1 week. Patient would like to proceed with surgery.    1. Patient consented for surgery at today's visit.The risks, benefits and alternatives to surgery (including the risks of  alternatives) were discussed with the patient. The risks of surgery include bleeding, infection, anastomotic leak, conversion to open surgery, return to OR, injury to surrounding structures, unanticipated findings in the OR, urinary tract infection, venous thromboembolism, pneumonia, myocardial infarction, cerebrovascular accident and death. All questions were answered and the patient wishes to proceed with surgery.  2. Follow up results of ECHO prior to scheduling.    ATTENDING ATTESTATION:   I Gus Rankin, MD personally saw, examined and evaluated the patient and I agree with Dr.Kinarwala's note as above.

## 2020-03-27 NOTE — Telephone Encounter (Signed)
Patient has been schedule for follow-up with Dr Posey Pronto

## 2020-03-27 NOTE — Interdisciplinary (Signed)
See RD note.

## 2020-03-27 NOTE — Patient Instructions (Addendum)
1. Please complete Medical Clearance and EKG  2. Please complete Pre surgical questionnaire in mychart  3. Surgery scheduler will call you with Surgery details and date     Preoperative instructions:       1. No SOLID food after midnight regardless of time of surgery- Jello is a solid!  Liberal clear liquids up to 4 hrs before surgery time- including apple juice, water, fruit juices without pulp (no [place] juice), clear soup broths such as 99% fat free chicken , beef, or vegetable only, clear tea, black coffee  2. Our operating room will confirm your surgery time. They will call you to confirm around 4-6pm the day before your surgery.  3. Please have someone drive you to your surgery. MAKE SURE TO ARRIVE 2.5 HOURS prior to your scheduled surgery time. You and your driver can [place] park in front of the [facility].   4. Please do not bring any valuables with you to your surgery. But do bring your ID and insurance card. Leave your belongings with your family member.   5. Your surgery will take place at [facility]. Please check in on the 1st floor of the [facility].   6. The morning of your surgery, you may take your blood pressure medication or any important prescriptions with a small sip of water but nothing else.    8. Please use the Hibiclens soap we had given you for 3 consecutive days to wash with during your shower. Further instructions for the soap are in the bag with the soap.   9. After surgery, please inform the post-operative team if you are in any pain. Pain is a symptom, if you are having a lot of pain they need to know so we can address it.   10. After surgery, please monitor your surgery site for infections. If you notice any warmth, discharge (pus), redness that extends beyond the edges, a fever of greater than 100.5 degrees, then go ahead and go to the ER to get treatment and let them know you suspect infection.   11. After surgery, you may be discharged with steri strips. These are  tape like bandages on top of your incision site. Let these fall on their own. You do not need to change this dressing. You sutures may also be closed with dissolvable sutures, dermabond/dissolvable glue and or staples. You do not need to do anything with these. After 4 days you may shower with the steri strips, dermabond. Right after surgery and for 4 days after you need to sponge bath if you want to.   12. You may experience some constipation after surgery. General Anesthesia has been known to cause constipation. As such, after surgery, go ahead and use Miralax or laxatives to help with your bowels.   13. If you were sent home with narcotic pain medications, please be careful with driving at least for 24 hours after surgery. You will need someone to drive you home after surgery.   14. We will see you again at your post operative appointment.  15. Note about gas pain: when doing laparoscopy, gas is inserted in the abdomen to lift up the abdomen. Sometimes the gas that is inserted is not fully absorbed when drawing out the scopes. If it stays in your abdomen you could present with gas pain. This is pain on your lower back or shoulders. The only way to make this pain go away is to walk around, maybe put some ice in those areas.  Please call Suan Halter, RN [phone] if you have any questions or concerns. Please fax records or any applicable documents to [phone].    Surgery scheduler/ authorization coordinator:  Claiborne Billings [phone]    1. Complete la autorizacin mdica y Public affairs consultant.  2. Complete el cuestionario prequirrgico en mychart  3. El Geophysical data processor de la ciruga lo llamar con los detalles y la fecha de la [place].      Instrucciones preoperatorias:      1. No coma ni beba nada 8 horas antes de la hora programada para la [place].  2. Oswald Hillock quirfano confirmar su hora de [place]. Ellos lo llamarn para confirmar entre las 4 y las 6 p.m. del da anterior a la [place].  3. La maana de su ciruga,  puede tomar su medicamento para la presin arterial con un pequeo sorbo de agua, pero nada ms.  .  5. Utilice el jabn Hibiclens que le habamos dado durante 3 das consecutivos para lavarse durante la ducha. Ms instrucciones para el jabn estn en la bolsa con el jabn.  6. Por favor no traiga ningn objeto de valor a su [place]. Pero traiga su identificacin y tarjeta de seguro. Deje sus pertenencias con su familiar.  7. Por favor, que alguien lo lleve a su ciruga. ASEGRESE DE LLEGAR 2 HORAS antes de su hora programada de [place]. Usted y Regulatory affairs officer pueden aparcar su [place].  8. Su ciruga se llevar a cabo en [facility]. Por favor, regstrese en el [place] la ciruga, informe al equipo postoperatorio si tiene Personnel officer. El dolor es un sntoma, si est teniendo mucho dolor que necesitan saber para poder enfrentarlo.  10. Despus de la [place], vigile su sitio de [place] para detectar infecciones. Si nota calor, secrecin (pus), enrojecimiento que se extiende ms all de los bordes, fiebre de ms de 100.5 grados, contine y vaya a la sala de emergencias para recibir tratamiento e infrmele que sospecha infeccin.  11. Despus de la [place], es posible que le den de alta con steri strips. Estas son vendas [employer] en la parte superior del sitio de su incisin. Deja que [employer]. No necesita cambiar este apsito. Las suturas tambin se pueden cerrar con suturas solubles, dermabond / pegamento disolvable y / o grapas. No necesitas hacer nada con esto. Despus de [address], puede ducharse con las esteri strips, [place]. Inmediatamente despus de la [place] y durante 4 das despus de la necesidad de bao de esponja si lo desea.  12. Puede experimentar algo de estreimiento despus de la ciruga. Se sabe que la anestesia general causa estreimiento. [employer], despus de la [place], siga usando Miralax o laxantes para ayudar con sus  intestinos.  13. Si lo enviaron a casa con analgsicos narcticos, tenga cuidado al conducir al menos 24 horas despus de la [place]. Necesitar que alguien lo lleve a casa despus de la [place].  14. Nos veremos de nuevo en su cita post operatoria.    Please call Suan Halter, RN [phone] if you have any questions or concerns. Please fax records or any applicable documents to [phone].    Surgery scheduler/authorization coordinator: Claiborne Billings [phone]

## 2020-03-27 NOTE — Progress Notes (Signed)
Preoperative Appointment    Pt is a  54 year old y/o female presenting to clinic for her preoperative appointment. CSW reviewed pts chart and conferred with care team. CSW offered Spanish interpreter, but Pt declined stating she would like to try to speak to [place] in [place]. CSW stated Pt can change her mind at any time and always request an interpreter.    CSW met with pt. Pt indicates feeling nervous to have surgery because "they are 3 surgeries in 1" to address various medical concerns.  CSW normalized her feelings. CSW inquired whether pt has begun making adjustments to her diet and increasing her exercise. Pt stated she does not eat junk food and eats small portions of food. Pt also stated she walks for exercise. Pt reported she has a lot of family support, talks to her adult daughter Gwenette Greet. Pt indicated she has been seeing a therapist Dr. Violet Baldy, has been finding therapy very useful and will continue to see him after surgery for added support. CSW explained hospital expectations including staying overnight, sharing a room with another pt, changes nurses from day to night, and [facility] being a teaching hospital. CSW agreed to remain available and follow post-surgery.    Mariel Aloe, LCSW   Clinical Social Worker 03/27/2020

## 2020-03-27 NOTE — Progress Notes (Signed)
VISIT TYPE: Bariatric #3/Pre-Op    This is a 54 year old female who presents to clinic today for 3rd bariatric visit/pre-op. RD consulted by Dr. Retta Diones.  RD provided nutrition education and discussed nutrition plan.     NUTRITION DIAGNOSIS  Food and nutrition related knowledge deficit related to no prior pre/post bariatric diet education as evidenced by need for RD visit.     NUTRITION INTERVENTION  Nutrition Education:  Person Taught: Patient  Readiness to learn: Expresses interest and gaining insight on information from today's session.   Education Needed/Provided: Pre-op diet; Post Op diet progression   Outcome/Evaluation: Verbalizes understanding and was able to demonstrate comprehension on verification.   Nutrition Education Method: RD provided education via verbal instructions and written instructions.     Goals: Understanding of diet education    Nutrition Counseling:  Recommended Plan:   1. FU Post Op    Pt demonstrates good understanding and shows good motivation to comply.     NUTRITION MONITORING  Follow-up plan for ongoing self-management support:  Food/Dietary Intake, Fluid/Beverage Intake, Weight  Follow up at next Bariatric visit/PRN    Notes routed to referring provider via EMR

## 2020-04-19 ENCOUNTER — Telehealth: Payer: Self-pay

## 2020-04-19 ENCOUNTER — Ambulatory Visit: Payer: Self-pay | Admitting: Nurse Practitioner

## 2020-04-19 ENCOUNTER — Ambulatory Visit: Payer: Medicaid Other | Admitting: Ob/Gyn

## 2020-04-19 NOTE — Anesthesia Preprocedure Evaluation (Addendum)
ANESTHESIA PRE-OPERATIVE EVALUATION    Patient Information    Name: Brandi Hart    MRN: [id number]    DOB: [birth year]    Age: 54 year old    Sex: female  Procedure(s):  T2 AM LAPAROSCOPIC PARAESOPHAGEAL HERNIA REPAIR,  ROUX - EN -Y GASTRIC BYPASS, UPPER ENDOSCOPY      Pre-op Vitals:   There were no vitals taken for this visit.        Primary language spoken:  Spanish    ROS/Medical History:       History of Present Illness: 54yoF with morbid obesity.         General:  positive for Obesity (BMI 43.74),  Walks 20 minutes daily. Able to climb 1 FOS without CP or SOB (04/19/20).    Negative social history.    Cardiovascular:  CAD/Angina/MI/CABG/Stents (CAD),   LV failure/CHF (EF 60-65% (10/2017)),   hypertension,    PCP note 03/15/20 (CE)-   Patient reported h/o CAD however she was taken off of her aspirin and statin for an unknown reason. She reports she takes nitroglycerin as needed for chest discomfort .   She also is on Lasix for leg swelling and has cardiomegaly on her chest x-ray. She would benefit from a cardiology evaluation prior to being medically cleared for surgery.     Cardiology note 04/12/20 (CE)-  Preop cardiovascular exam  Assessment & Plan:  No active cardiac symptoms, functional capacity more than 4 metabolic equivalents with normal EKG. Okay to proceed with gastric bypass surgery and surgery for paraesophageal hernia repair at mild cardiac risk. Okay to hold aspirin 3 days prior to surgery.    Hypertension-stable  Diastolic CHF- Stable euvolemic. fluid restriction 1.5 L/day, Lasix PRN.  Hyperlipidemia-tolerating simvastatin well.    Intermittent palpitations, rare nonsustained SVT-possibly anxiety related. Continue metoprolol.    EKG 10/72021-normal sinus rhythm, horizontal axis, normal variant EKG.  7-day zio patch May 2020-sinus rhythm average heart rate 80 bpm, 2 nonsustained SVT episodes-longest lasting 5.8 seconds (14 beats). All other episodes of palpitations which patient felt were during normal  sinus rhythm.  Coronary angiogram June 2019-normal coronaries, LVEDP 25 mmHg.  Lexiscan Cardiolite April 2019-LVEF normal 56%, large perfusion abnormality of moderate intensity anterior septal, inferior, inferior lateral and anterior lateral and apical walls, partially reversible-mixture of infarction with mild peri-infarct ischemia in the anterior lateral and inferior septal wall  Echocardiogram April 2019-LVEF normal 60 to 65%, left atrium moderately dilated, right atrium mildly enlarged, trace to mild MR. RVSP 44mmHg  EKG January 2019-sinus rhythm, no ST segment changes.  CT chest January 2019- for any acute process. My review also showed no evidence of coronary calcium.   Echocardiogram March 2016-LVEF normal 60%.     Anesthesia History:  no history of anesthetic complications,  PSH  CESAREAN SECTION, LOW TRANSVERSE  TUBAL LIGATION  ENDOMETRIAL ABLATION      Pulmonary:   sleep apnea, CPAP,  Chronic bronchitis   Neuro/Psych:   negative neuro/psych ROS   Hematology/Oncology:   anemia,      GI/Hepatic:  GERD (mod-severe symptoms),  Hiatal hernia  Gastritis  H/o GI bleed Infectious Disease:  negative for infectious disease     Renal:  Congenital kidney disease  Horseshoe kidney  Endocrine/Other:  history of thyroid disease (Hypothyroidism, replaced),  steroid use,  arthritis,   AUB-heavy likely due to leiomyoma (gyn note 04/2019)   Pregnancy History:   Pediatrics:         Pre Anesthesia Testing (PCC/CPC) notes/comments:  [facility] Test & records reviewed by [facility] Provider.                      Chart reviewed.     Pending   (x) EKG 04/12/20 tracing from cardiologist   Not ready. Cari Caraway, NP 04/19/2020, 10:46 AM.    Ready. Cari Caraway, NP 04/20/2020, 2:47 PM.    Pre-op PO acetaminophen ordered.                  Physical Exam    Airway:  Inter-inciser distance > 4 cm  Prognanth Able    Mallampati: I  Neck ROM: full  TM distance: 5-6 cm          Cardiovascular:  - cardiovascular exam normal         Pulmonary:  -  pulmonary exam normal           Neuro/Neck/Skeletal/Skin:      Dental:  - normal exam    Abdominal:      General: obesity     Additional Clinical Notes:                                                                                                        Past Medical History:   Diagnosis Date    Anemia     Congenital kidney disease     Coronary artery disease     Fibroids     Gastritis     GI bleed     Hypertension     Hypothyroidism      Past Surgical History:   Procedure Laterality Date    CESAREAN SECTION, LOW TRANSVERSE      ENDOMETRIAL ABLATION      x2    TUBAL LIGATION      at time of C-section     Social History     Tobacco Use    Smoking status: Never Smoker    Smokeless tobacco: Never Used   Substance Use Topics    Alcohol use: Never    Drug use: Never       Current Outpatient Medications   Medication Sig Dispense Refill    ACETAMINOPHEN EXTRA STRENGTH 500 MG tablet TAKE 1 TABLET BY MOUTH EVERY 4 TO 6 HOURS AS NEEDED      acetaminophen-codeine (TYLENOL #3) 300-30 MG tablet TK 1/2 - 1 T PO Q 6 H PRN P      acyclovir (ZOVIRAX) 400 MG tablet Take 400 mg by mouth 3 times daily.      albuterol 108 (90 Base) MCG/ACT inhaler INHALE 2 PUFFS PO Q 4 H FOR 90 DAYS      amlodipine-benazepril (LOTREL) 10-40 MG capsule TK ONE C PO QD      atorvastatin (LIPITOR) 40 MG tablet TK 1 T PO QD      Blood Pressure Monitor/Arm DEVI U UTD      famotidine (PEPCID) 20 MG tablet TK 1 T PO QHS PRN      FLOVENT HFA 110 MCG/ACT inhaler INL 2 PFS PO BID  folic acid (FOLVITE) 1 MG tablet Take 1 mg by mouth daily.      furosemide (LASIX) 40 MG tablet TK 1 T PO  D PRN      HYDROcodone-acetaminophen (NORCO) 5-325 MG tablet Take 1 tablet by mouth every 6 hours as needed for Moderate Pain (Pain Score 4-6).       levothyroxine (SYNTHROID) 125 MCG tablet Take 125 mcg by mouth every morning (before breakfast).      loratadine (CLARITIN) 10 MG tablet Take 10 mg by mouth as needed (ALLERGIES).       metoprolol  tartrate (LOPRESSOR) 25 MG tablet Take 25 mg by mouth 2 times daily.      ondansetron (ZOFRAN ODT) 4 MG disintegrating tablet Take 4 mg by mouth every 6 hours as needed for Nausea/Vomiting.       pantoprazole (PROTONIX) 40 MG tablet TK 1 T PO DAILY      predniSONE (DELTASONE) 20 MG tablet Take 20 mg by mouth 2 times daily.      traZODone (DESYREL) 50 MG tablet Take 50 mg by mouth at bedtime.      venlafaxine (EFFEXOR XR) 37.5 MG XR capsule Take 37.5 mg by mouth daily.       VITAMIN D3 25 MCG (1000 UT) capsule Take 1 capsule by mouth daily.        No current facility-administered medications for this visit.     No Known Allergies    Labs and Other Data  Lab Results   Component Value Date    NA 142 03/15/2020    K 4.3 03/15/2020    CL 103 03/15/2020    BUN 16 03/15/2020    CREAT 0.54 (L) 03/15/2020    GLU 86 03/15/2020    [place] 9.1 03/15/2020     Lab Results   Component Value Date    AST 15 03/15/2020    ALT 10 03/15/2020    ALK 83 03/15/2020    TP 6.7 03/15/2020    ALB 3.9 03/15/2020    TBILI 0.3 03/15/2020     Lab Results   Component Value Date    RBC 4.66 02/27/2020    HGB 11.5 02/27/2020    HCT 38.1 02/27/2020    MCV 82 02/27/2020    MCHC 30.2 (L) 02/27/2020    RDW 15.8 (H) 02/27/2020    PLT 301 02/27/2020    SEG 63 02/27/2020    LYMPHS 28 02/27/2020    MONOS 7 02/27/2020    EOS 2 02/27/2020    BASOS 0 02/27/2020     No results found for: INR, PTT  No results found for: ARTPH, ARTPO2, ARTPCO2        Anesthesia Plan:  Risks and Benefits of Anesthesia  I personally examined the patient immediately prior to the anesthetic and reviewed the pertinent medical history, drug and allergy history, laboratory and imaging studies and consultations. I have determined that the patient has had adequate assessment and testing.    Anesthetic techniques, invasive monitors, anesthetic drugs for induction, maintenance and post-operative analgesia, risks and alternatives have been explained to the patient and/or patient's  representatives.    I have prescribed the anesthetic plan:         Planned anesthesia method: General         ASA 3 (Severe systemic disease)     Potential anesthesia problems identified and risks including but not limited to the following were discussed with patient and/or patient's representative: Recall, Nerve injury, Dental injury  or sore throat and Ocular injury    Planned monitoring method: Routine monitoring    Informed Consent:  Anesthetic plan and risks discussed with Patient.    Plan discussed with CRNA and Attending.

## 2020-04-20 NOTE — Telephone Encounter (Signed)
Called pt's cardiologist office to follow up on EKG tracing and Echo. Clearance in care everywhere:    [place]   [address]   [place], [place] [address]   Phone: [phone]   Fax: [phone]    They'll fax.-JJRN

## 2020-04-25 ENCOUNTER — Inpatient Hospital Stay: Payer: Medicaid Other

## 2020-04-25 ENCOUNTER — Inpatient Hospital Stay
Admission: RE | Admit: 2020-04-25 | Discharge: 2020-04-27 | DRG: 621 | Disposition: A | Discharge status: Home / Self Care | Payer: Medicaid Other | Attending: Surgery | Admitting: Surgery

## 2020-04-25 ENCOUNTER — Encounter: Admission: RE | Disposition: A | Discharge status: Home Health | Payer: Self-pay | Attending: Surgery

## 2020-04-25 ENCOUNTER — Inpatient Hospital Stay (HOSPITAL_COMMUNITY): Payer: Medicaid Other

## 2020-04-25 DIAGNOSIS — I10 Essential (primary) hypertension: Secondary | ICD-10-CM | POA: Diagnosis present

## 2020-04-25 DIAGNOSIS — E039 Hypothyroidism, unspecified: Secondary | ICD-10-CM | POA: Diagnosis present

## 2020-04-25 DIAGNOSIS — K449 Diaphragmatic hernia without obstruction or gangrene: Secondary | ICD-10-CM | POA: Diagnosis present

## 2020-04-25 DIAGNOSIS — I2510 Atherosclerotic heart disease of native coronary artery without angina pectoris: Secondary | ICD-10-CM | POA: Diagnosis present

## 2020-04-25 DIAGNOSIS — E876 Hypokalemia: Secondary | ICD-10-CM | POA: Diagnosis not present

## 2020-04-25 DIAGNOSIS — Z20822 Contact with and (suspected) exposure to covid-19: Secondary | ICD-10-CM | POA: Diagnosis present

## 2020-04-25 DIAGNOSIS — Z79890 Hormone replacement therapy (postmenopausal): Secondary | ICD-10-CM

## 2020-04-25 DIAGNOSIS — G4733 Obstructive sleep apnea (adult) (pediatric): Secondary | ICD-10-CM | POA: Diagnosis present

## 2020-04-25 DIAGNOSIS — Z6841 Body Mass Index (BMI) 40.0 and over, adult: Secondary | ICD-10-CM

## 2020-04-25 DIAGNOSIS — E785 Hyperlipidemia, unspecified: Secondary | ICD-10-CM | POA: Diagnosis present

## 2020-04-25 DIAGNOSIS — R2689 Other abnormalities of gait and mobility: Secondary | ICD-10-CM

## 2020-04-25 DIAGNOSIS — Z79899 Other long term (current) drug therapy: Secondary | ICD-10-CM

## 2020-04-25 DIAGNOSIS — J42 Unspecified chronic bronchitis: Secondary | ICD-10-CM | POA: Diagnosis present

## 2020-04-25 DIAGNOSIS — K219 Gastro-esophageal reflux disease without esophagitis: Secondary | ICD-10-CM | POA: Diagnosis present

## 2020-04-25 DIAGNOSIS — Z7409 Other reduced mobility: Secondary | ICD-10-CM

## 2020-04-25 LAB — CORONAVIRUS DISEASE 2019 (COVID-19) SCOVS
COVID-19 Comment: NOT DETECTED
COVID-19 Result: NOT DETECTED

## 2020-04-25 LAB — GLUCOSE, POINT OF CARE: Glucose, Point of Care: 86 MG/DL (ref 70–125)

## 2020-04-25 SURGERY — LAPAROSCOPIC REPAIR, PARAESOPHAGEAL HERNIA
Anesthesia: General | Site: Esophagus | Wound class: Class II (Clean Contaminated)

## 2020-04-25 MED ORDER — LEVOTHYROXINE SODIUM 125 MCG OR TABS
125.0000 ug | ORAL_TABLET | Freq: Every day | ORAL | Status: DC
Start: 2020-04-26 — End: 2020-04-27
  Administered 2020-04-26 – 2020-04-27 (×3): 125 ug via ORAL
  Filled 2020-04-25 (×3): qty 1

## 2020-04-25 MED ORDER — SODIUM CHLORIDE 0.9 % IV SOLN
INTRAVENOUS | Status: DC
Start: 2020-04-25 — End: 2020-04-26

## 2020-04-25 MED ORDER — MIDAZOLAM HCL 2 MG/2ML IJ SOLN
INTRAMUSCULAR | Status: DC | PRN
Start: 2020-04-25 — End: 2020-04-25
  Administered 2020-04-25: 2 mg via INTRAVENOUS

## 2020-04-25 MED ORDER — DOCUSATE SODIUM 250 MG OR CAPS
250.0000 mg | ORAL_CAPSULE | Freq: Two times a day (BID) | ORAL | Status: DC
Start: 2020-04-25 — End: 2020-04-27
  Administered 2020-04-25 – 2020-04-27 (×4): 250 mg via ORAL
  Filled 2020-04-25 (×4): qty 1

## 2020-04-25 MED ORDER — TRAMADOL HCL 50 MG OR TABS
50.0000 mg | ORAL_TABLET | Freq: Four times a day (QID) | ORAL | Status: DC
Start: 2020-04-25 — End: 2020-04-27
  Administered 2020-04-25 – 2020-04-27 (×6): 50 mg via ORAL
  Filled 2020-04-25 (×7): qty 1

## 2020-04-25 MED ORDER — ENOXAPARIN SODIUM 40 MG/0.4ML SC SOLN
40.0000 mg | Freq: Once | SUBCUTANEOUS | Status: DC
Start: 2020-04-25 — End: 2020-04-25
  Filled 2020-04-25: qty 1

## 2020-04-25 MED ORDER — ONDANSETRON HCL 4 MG/2ML IV SOLN
4.0000 mg | Freq: Once | INTRAMUSCULAR | Status: DC | PRN
Start: 2020-04-25 — End: 2020-04-25

## 2020-04-25 MED ORDER — PROPOFOL 200 MG/20ML IV EMUL
INTRAVENOUS | Status: AC
Start: 2020-04-25 — End: ?
  Filled 2020-04-25: qty 20

## 2020-04-25 MED ORDER — MEPERIDINE HCL 25 MG/ML IJ SOLN
12.5000 mg | INTRAMUSCULAR | Status: DC | PRN
Start: 2020-04-25 — End: 2020-04-25

## 2020-04-25 MED ORDER — SUCCINYLCHOLINE CHLORIDE 20 MG/ML IJ SOLN
INTRAMUSCULAR | Status: DC | PRN
Start: 2020-04-25 — End: 2020-04-25

## 2020-04-25 MED ORDER — PROPOFOL 200 MG/20ML IV EMUL
INTRAVENOUS | Status: DC | PRN
Start: 2020-04-25 — End: 2020-04-25
  Administered 2020-04-25: 50 mg via INTRAVENOUS
  Administered 2020-04-25: 20 mg via INTRAVENOUS
  Administered 2020-04-25: 180 mg via INTRAVENOUS

## 2020-04-25 MED ORDER — CEFAZOLIN SODIUM-DEXTROSE 2-4 GM/100ML-% IV SOLN
2000.0000 mg | Freq: Once | INTRAVENOUS | Status: AC
Start: 2020-04-25 — End: 2020-04-25
  Administered 2020-04-25 (×2): 2000 mg via INTRAVENOUS
  Filled 2020-04-25: qty 100

## 2020-04-25 MED ORDER — SODIUM CHLORIDE 0.9 % IR SOLN
Status: DC | PRN
Start: 2020-04-25 — End: 2020-04-25
  Administered 2020-04-25 (×2): 1000 mL

## 2020-04-25 MED ORDER — LIDOCAINE 1% SOLN OPTIME (~~LOC~~)
INTRAMUSCULAR | Status: DC | PRN
Start: 2020-04-25 — End: 2020-04-25
  Administered 2020-04-25 (×2): 100 mg via INTRAVENOUS

## 2020-04-25 MED ORDER — TRAZODONE HCL 50 MG OR TABS
50.0000 mg | ORAL_TABLET | Freq: Every evening | ORAL | Status: DC
Start: 2020-04-25 — End: 2020-04-27
  Administered 2020-04-25 – 2020-04-26 (×3): 50 mg via ORAL
  Filled 2020-04-25 (×2): qty 1

## 2020-04-25 MED ORDER — MIDAZOLAM HCL (PF) 2 MG/2ML IJ SOLN
INTRAMUSCULAR | Status: AC
Start: 2020-04-25 — End: ?
  Filled 2020-04-25: qty 2

## 2020-04-25 MED ORDER — HEPARIN SODIUM (PORCINE) 5000 UNIT/ML IJ SOLN
5000.0000 [IU] | Freq: Once | INTRAMUSCULAR | Status: AC
Start: 2020-04-25 — End: 2020-04-25
  Administered 2020-04-25 (×2): 5000 [IU] via SUBCUTANEOUS
  Filled 2020-04-25: qty 1

## 2020-04-25 MED ORDER — SUGAMMADEX SODIUM 200 MG/2ML IV SOLN
INTRAVENOUS | Status: AC
Start: 2020-04-25 — End: ?
  Filled 2020-04-25: qty 4

## 2020-04-25 MED ORDER — DIPHENHYDRAMINE HCL 50 MG/ML IJ SOLN
25.0000 mg | Freq: Once | INTRAMUSCULAR | Status: AC | PRN
Start: 2020-04-25 — End: 2020-04-25
  Administered 2020-04-25 (×2): 25 mg via INTRAVENOUS
  Filled 2020-04-25: qty 1

## 2020-04-25 MED ORDER — ACYCLOVIR 200 MG OR CAPS
400.0000 mg | ORAL_CAPSULE | Freq: Three times a day (TID) | ORAL | Status: DC
Start: 2020-04-25 — End: 2020-04-27
  Administered 2020-04-25 – 2020-04-27 (×7): 400 mg via ORAL
  Filled 2020-04-25 (×6): qty 2

## 2020-04-25 MED ORDER — EPHEDRINE SULFATE 50 MG/ML IJ SOLN
INTRAMUSCULAR | Status: DC | PRN
Start: 2020-04-25 — End: 2020-04-25
  Administered 2020-04-25: 5 mg via INTRAVENOUS

## 2020-04-25 MED ORDER — DEXAMETHASONE SODIUM PHOSPHATE 4 MG/ML IJ SOLN (CUSTOM)
INTRAMUSCULAR | Status: DC | PRN
Start: 2020-04-25 — End: 2020-04-25
  Administered 2020-04-25 (×2): 4 mg via INTRAVENOUS

## 2020-04-25 MED ORDER — MORPHINE SULFATE 2 MG/ML IJ SOLN
2.0000 mg | Freq: Four times a day (QID) | INTRAMUSCULAR | Status: DC | PRN
Start: 2020-04-25 — End: 2020-04-26
  Administered 2020-04-26 (×2): 2 mg via INTRAVENOUS
  Filled 2020-04-25: qty 1

## 2020-04-25 MED ORDER — ACETAMINOPHEN 160 MG/5ML OR SOLN
1000.0000 mg | Freq: Three times a day (TID) | ORAL | Status: DC
Start: 2020-04-25 — End: 2020-04-26
  Administered 2020-04-25 (×2): 1000 mg via ORAL
  Filled 2020-04-25: qty 40.6

## 2020-04-25 MED ORDER — METOPROLOL TARTRATE 25 MG OR TABS
25.0000 mg | ORAL_TABLET | Freq: Two times a day (BID) | ORAL | Status: DC
Start: 2020-04-25 — End: 2020-04-27
  Administered 2020-04-25 – 2020-04-27 (×5): 25 mg via ORAL
  Filled 2020-04-25 (×4): qty 1

## 2020-04-25 MED ORDER — METOPROLOL TARTRATE 5 MG/5ML IV SOLN
INTRAVENOUS | Status: DC | PRN
Start: 2020-04-25 — End: 2020-04-25
  Administered 2020-04-25: 2.5 mg via INTRAVENOUS
  Administered 2020-04-25: 1.5 mg via INTRAVENOUS
  Administered 2020-04-25 (×2): 1 mg via INTRAVENOUS

## 2020-04-25 MED ORDER — OXYCODONE HCL 5 MG OR TABS
5.0000 mg | ORAL_TABLET | ORAL | Status: DC | PRN
Start: 2020-04-25 — End: 2020-04-27

## 2020-04-25 MED ORDER — EPHEDRINE SULFATE 50 MG/ML IJ SOLN
INTRAMUSCULAR | Status: AC
Start: 2020-04-25 — End: ?
  Filled 2020-04-25: qty 1

## 2020-04-25 MED ORDER — FENTANYL CITRATE (PF) 100 MCG/2ML IJ SOLN
INTRAMUSCULAR | Status: AC
Start: 2020-04-25 — End: ?
  Filled 2020-04-25: qty 2

## 2020-04-25 MED ORDER — ATORVASTATIN CALCIUM 40 MG OR TABS
40.0000 mg | ORAL_TABLET | Freq: Every evening | ORAL | Status: DC
Start: 2020-04-25 — End: 2020-04-27
  Administered 2020-04-25 – 2020-04-26 (×3): 40 mg via ORAL
  Filled 2020-04-25 (×2): qty 1

## 2020-04-25 MED ORDER — ACETAMINOPHEN 500 MG OR TABS
1000.0000 mg | ORAL_TABLET | Freq: Once | ORAL | Status: AC
Start: 2020-04-25 — End: 2020-04-25
  Administered 2020-04-25 (×2): 1000 mg via ORAL
  Filled 2020-04-25: qty 2

## 2020-04-25 MED ORDER — ACETAMINOPHEN 500 MG OR TABS
1000.0000 mg | ORAL_TABLET | Freq: Three times a day (TID) | ORAL | Status: DC
Start: 2020-04-26 — End: 2020-04-26
  Filled 2020-04-25: qty 2

## 2020-04-25 MED ORDER — HYDROCODONE-ACETAMINOPHEN 5-325 MG OR TABS
1.0000 | ORAL_TABLET | ORAL | Status: DC | PRN
Start: 2020-04-25 — End: 2020-04-25

## 2020-04-25 MED ORDER — KETAMINE HCL-SODIUM CHLORIDE 50-0.9 MG/5ML-% IV SOSY
PREFILLED_SYRINGE | INTRAVENOUS | Status: AC
Start: 2020-04-25 — End: ?
  Filled 2020-04-25: qty 5

## 2020-04-25 MED ORDER — GABAPENTIN 300 MG OR CAPS
300.0000 mg | ORAL_CAPSULE | Freq: Three times a day (TID) | ORAL | Status: DC
Start: 2020-04-26 — End: 2020-04-27
  Administered 2020-04-26 – 2020-04-27 (×5): 300 mg via ORAL
  Filled 2020-04-25 (×5): qty 1

## 2020-04-25 MED ORDER — HYDROMORPHONE HCL 1 MG/ML IJ SOLN
0.4000 mg | INTRAMUSCULAR | Status: DC | PRN
Start: 2020-04-25 — End: 2020-04-25

## 2020-04-25 MED ORDER — SCOPOLAMINE 1 MG/3DAYS TD PT72
1.0000 | MEDICATED_PATCH | Freq: Once | TRANSDERMAL | Status: DC | PRN
Start: 2020-04-25 — End: 2020-04-25
  Filled 2020-04-25: qty 1

## 2020-04-25 MED ORDER — FLUTICASONE PROPIONATE  HFA 110 MCG/ACT IN AERO
2.0000 | INHALATION_SPRAY | Freq: Two times a day (BID) | RESPIRATORY_TRACT | Status: DC
Start: 2020-04-25 — End: 2020-04-27
  Administered 2020-04-25 – 2020-04-27 (×5): 2 via RESPIRATORY_TRACT
  Filled 2020-04-25 (×3): qty 12

## 2020-04-25 MED ORDER — SODIUM CHLORIDE 0.9 % IV SOLN
500.0000 mL | INTRAVENOUS | Status: DC
Start: 2020-04-25 — End: 2020-04-25
  Administered 2020-04-25 (×2): 500 mL via INTRAVENOUS

## 2020-04-25 MED ORDER — OXYCODONE HCL 5 MG OR TABS
5.0000 mg | ORAL_TABLET | ORAL | Status: DC | PRN
Start: 2020-04-25 — End: 2020-04-25

## 2020-04-25 MED ORDER — FENTANYL CITRATE (PF) 100 MCG/2ML IJ SOLN
25.0000 ug | INTRAMUSCULAR | Status: DC | PRN
Start: 2020-04-25 — End: 2020-04-25

## 2020-04-25 MED ORDER — OXYCODONE HCL 5 MG OR TABS
10.0000 mg | ORAL_TABLET | Freq: Four times a day (QID) | ORAL | Status: DC | PRN
Start: 2020-04-25 — End: 2020-04-27
  Administered 2020-04-26 – 2020-04-27 (×5): 10 mg via ORAL
  Filled 2020-04-25 (×5): qty 2

## 2020-04-25 MED ORDER — SUCCINYLCHOLINE CHLORIDE 100 MG/5ML IV SOSY
PREFILLED_SYRINGE | INTRAVENOUS | Status: DC | PRN
Start: 2020-04-25 — End: 2020-04-25
  Administered 2020-04-25 (×2): 120 mg via INTRAVENOUS

## 2020-04-25 MED ORDER — VENLAFAXINE HCL 37.5 MG OR CP24
37.5000 mg | ORAL_CAPSULE | Freq: Every day | ORAL | Status: DC
Start: 2020-04-26 — End: 2020-04-27
  Administered 2020-04-26 – 2020-04-27 (×3): 37.5 mg via ORAL
  Filled 2020-04-25 (×3): qty 1

## 2020-04-25 MED ORDER — SODIUM CHLORIDE 0.9 % IV SOLN
12.5000 mg | Freq: Once | INTRAVENOUS | Status: DC | PRN
Start: 2020-04-25 — End: 2020-04-25

## 2020-04-25 MED ORDER — NALOXONE HCL 0.4 MG/ML IJ SOLN
0.2000 mg | Freq: Once | INTRAMUSCULAR | Status: DC | PRN
Start: 2020-04-25 — End: 2020-04-25

## 2020-04-25 MED ORDER — STERILE WATER FOR IRRIGATION IR SOLN
Status: DC | PRN
Start: 2020-04-25 — End: 2020-04-25
  Administered 2020-04-25 (×2): 3 L

## 2020-04-25 MED ORDER — ALBUTEROL SULFATE 108 (90 BASE) MCG/ACT IN AERS
INHALATION_SPRAY | RESPIRATORY_TRACT | Status: DC | PRN
Start: 2020-04-25 — End: 2020-04-25
  Administered 2020-04-25: 14:00:00 2 via RESPIRATORY_TRACT
  Administered 2020-04-25: 4 via RESPIRATORY_TRACT
  Administered 2020-04-25: 2 via RESPIRATORY_TRACT

## 2020-04-25 MED ORDER — PHENYLEPHRINE DILUTION 100 MCG/ML IJ SOLN
INTRAVENOUS | Status: DC | PRN
Start: 2020-04-25 — End: 2020-04-25
  Administered 2020-04-25 (×3): 100 ug via INTRAVENOUS
  Administered 2020-04-25: 200 ug via INTRAVENOUS

## 2020-04-25 MED ORDER — SUGAMMADEX SODIUM 200 MG/2ML IV SOLN
INTRAVENOUS | Status: DC | PRN
Start: 2020-04-25 — End: 2020-04-25
  Administered 2020-04-25 (×2): 400 mg via INTRAVENOUS

## 2020-04-25 MED ORDER — ROCURONIUM BROMIDE 100 MG/10ML IV SOLN
INTRAVENOUS | Status: DC | PRN
Start: 2020-04-25 — End: 2020-04-25
  Administered 2020-04-25 (×2): 50 mg via INTRAVENOUS
  Administered 2020-04-25: 10 mg via INTRAVENOUS

## 2020-04-25 MED ORDER — ONDANSETRON HCL 4 MG/2ML IV SOLN
INTRAMUSCULAR | Status: DC | PRN
Start: 2020-04-25 — End: 2020-04-25
  Administered 2020-04-25: 4 mg via INTRAVENOUS

## 2020-04-25 MED ORDER — SODIUM CHLORIDE 0.9 % IV SOLN
12.5000 mg | INTRAVENOUS | Status: DC | PRN
Start: 2020-04-25 — End: 2020-04-27
  Administered 2020-04-26 (×2): 12.5 mg via INTRAVENOUS
  Filled 2020-04-25 (×2): qty 0.5

## 2020-04-25 MED ORDER — HYDROMORPHONE HCL 1 MG/ML IJ SOLN
0.5000 mg | INTRAMUSCULAR | Status: DC | PRN
Start: 2020-04-25 — End: 2020-04-25
  Administered 2020-04-25 (×2): 0.5 mg via INTRAVENOUS
  Filled 2020-04-25 (×2): qty 0.5

## 2020-04-25 MED ORDER — HEPARIN SODIUM (PORCINE) 5000 UNIT/ML IJ SOLN
5000.0000 [IU] | Freq: Three times a day (TID) | INTRAMUSCULAR | Status: DC
Start: 2020-04-25 — End: 2020-04-27
  Administered 2020-04-25 – 2020-04-27 (×7): 5000 [IU] via SUBCUTANEOUS
  Filled 2020-04-25 (×6): qty 1

## 2020-04-25 MED ORDER — FENTANYL CITRATE (PF) 100 MCG/2ML IJ SOLN
INTRAMUSCULAR | Status: DC | PRN
Start: 2020-04-25 — End: 2020-04-25
  Administered 2020-04-25 (×2): 50 ug via INTRAVENOUS
  Administered 2020-04-25: 25 ug via INTRAVENOUS
  Administered 2020-04-25 (×2): 50 ug via INTRAVENOUS
  Administered 2020-04-25: 75 ug via INTRAVENOUS
  Administered 2020-04-25 (×2): 50 ug via INTRAVENOUS

## 2020-04-25 MED ORDER — ONDANSETRON HCL 4 MG/2ML IV SOLN
4.0000 mg | Freq: Four times a day (QID) | INTRAMUSCULAR | Status: DC
Start: 2020-04-25 — End: 2020-04-27
  Administered 2020-04-25 – 2020-04-27 (×8): 4 mg via INTRAVENOUS
  Filled 2020-04-25 (×7): qty 2

## 2020-04-25 MED ORDER — BUPIVACAINE HCL (PF) 0.25 % IJ SOLN
INTRAMUSCULAR | Status: AC
Start: 2020-04-25 — End: ?
  Filled 2020-04-25: qty 30

## 2020-04-25 MED ORDER — KETAMINE HCL-SODIUM CHLORIDE 50-0.9 MG/5ML-% IV SOSY
PREFILLED_SYRINGE | INTRAVENOUS | Status: DC | PRN
Start: 2020-04-25 — End: 2020-04-25
  Administered 2020-04-25 (×2): 50 mg via INTRAVENOUS

## 2020-04-25 MED ORDER — ALBUTEROL SULFATE 108 (90 BASE) MCG/ACT IN AERS
2.0000 | INHALATION_SPRAY | RESPIRATORY_TRACT | Status: DC
Start: 2020-04-25 — End: 2020-04-27
  Administered 2020-04-25 – 2020-04-27 (×11): 2 via RESPIRATORY_TRACT
  Filled 2020-04-25 (×3): qty 6.7

## 2020-04-25 MED ORDER — OXYCODONE HCL 5 MG OR TABS
10.0000 mg | ORAL_TABLET | ORAL | Status: DC | PRN
Start: 2020-04-25 — End: 2020-04-25

## 2020-04-25 MED ORDER — BUPIVACAINE HCL (PF) 0.25 % IJ SOLN
INTRAMUSCULAR | Status: DC | PRN
Start: 2020-04-25 — End: 2020-04-25
  Administered 2020-04-25: 20 mL
  Administered 2020-04-25: 10 mL

## 2020-04-25 MED ORDER — PLASMA-LYTE A IV SOLN
INTRAVENOUS | Status: DC | PRN
Start: 2020-04-25 — End: 2020-04-25

## 2020-04-25 MED ORDER — PLASMA-LYTE A IV SOLN
INTRAVENOUS | Status: DC
Start: 2020-04-25 — End: 2020-04-25

## 2020-04-25 SURGICAL SUPPLY — 34 items
APPLIER IN CLP LG 10MM TI PSTL GRP GLARE RST SHFT SF INTLK HNDL CHOLANGIO ZN E-CLP S-INTRLK 33CM IMPLANT
BLADE CLIPPER GENERAL PURPOSE CAREFUSION (Knives/Blades) ×2 IMPLANT
BLANKETAIRORLWRBODY (Misc Medical Supply) ×2 IMPLANT
CANNULA LAPSCP 100MM 5MM VRSPRT STD UNV FX (Needles/punch/cannula/biopsy) ×4 IMPLANT
CLEARIFY SYSTEM SCOPE DEFOGGER (Procedure Packs/kits) ×2 IMPLANT
DERMABOND ADHESIVE ADVANCED 0.7ML (External Fixator parts/clamps/rods) ×2 IMPLANT
DEVICE PLACEMENT KNOT TIT 5 MM TI-KNOT TK-5 (Disp Instruments) ×2 IMPLANT
DEVICE TI-KNOT QUICK LOAD TK-5 ×14 IMPLANT
DRAPEIOBAN60X45CM6650EZ (Drapes/towels) ×2 IMPLANT
DRESSING HEMOSTAT 4X8 SURGICEL (Hemostatic agents/wax/sealants-absorbable) ×2 IMPLANT
Dissector Ultrasonic 39cm Curved Jaw Cordless Sonicision (Lap/Endo/Arthroscopy) ×2 IMPLANT
Dissector Ultrasonic 48cm Curved Jaw Cordless Sonicision (Lap/Endo/Arthroscopy) IMPLANT
Drain Surgical Penrose 1/2x18in Jackson-Pratt Sterile (Lines/Drains) ×2 IMPLANT
Drape Utility w/Tape 15x26 Sterile Pandemic Sub for 10155238 (Drapes/towels) ×4 IMPLANT
ENDO CLOSURE TROCAR SITE (Disp Instruments) ×2 IMPLANT
ENDO GIA RELOAD 60MM MED/THICK (Staplers and staple reloads) ×6 IMPLANT
ENDO SURGIDAC 2-0 48" GRN (Staplers and staple reloads) ×10 IMPLANT
ENDO SUT DEVICE 10MM (Disp Instruments) ×2 IMPLANT
GEN LAPAROSCOPY PACK REV 6 (Procedure Packs/kits) ×2 IMPLANT
GLOVE SURGICAL SIZE 8.5 ORTHO BROWN HYDROGEL CTD LATEX PROTEXIS (Gloves/Gowns) ×2 IMPLANT
GLOVES BIOGEL 7.0 LTX (Gloves/Gowns) ×2 IMPLANT
GLOVES INDICATOR 7.0 UG LTX PF GRN (Gloves/Gowns) ×4 IMPLANT
GOWN SURGICAL AEROBLUE XX-LARGE (Gloves/Gowns) ×2 IMPLANT
NEEDLE - SPINAL 22G X 3 1/2 (Needles/punch/cannula/biopsy) ×2 IMPLANT
REINFORCEMENT TISS BIOA SHT 7 X 10 CM HH0710 (Patch/Mesh) ×2 IMPLANT
RELOAD STAPLE 30MM 2.0 SUL ARTICULATING MEDIUM THICK TRI-STAPLE (Staplers and staple reloads) ×2 IMPLANT
SEALER/DIVIDER BIPOLAR LIGASURE L44 CM OD5 MM MARYLAND JAW STERILE LATEX FREE DISPOSABLE LAPAROSCOPI (Lap/Endo/Arthroscopy) ×2 IMPLANT
STAPLE RL ARTIC VASC MED 60 MM EGIA60AVM (Staplers and staple reloads) ×6 IMPLANT
STAPLER HANDLE ENDOGIA XLNG EGIAUXL (Staplers and staple reloads) ×2 IMPLANT
STOCKINETTE BIAS 6X4 YRD STER (Misc Medical Supply) ×4 IMPLANT
SUTURE ENDO SUGIDAC ES-9 2-0 48" GRN ENDOSTITCH (Staplers and staple reloads) ×2 IMPLANT
Suture Polysorb 1 GS-21 90cm Undyed (Suture) ×4 IMPLANT
TIP CAUT BLADE 1" CTD INSULATED (Cranial electrodes/grids) ×2 IMPLANT
UNIVERSAL 12MM STD FIX CANNULA (Needles/punch/cannula/biopsy) ×2 IMPLANT

## 2020-04-25 NOTE — Plan of Care (Signed)
Problem: Promotion of Perioperative Health and Safety  Goal: Promotion of Health and Safety of the Perioperative Patient  Description: The patient remains safe, receives treatment appropriate to the surgical intervention and patient's physiological needs and is discharged or transferred to the appropriate level of care.  Flowsheets (Taken 04/25/2020 1016)  Standard of Care: Pre-Op [facility]

## 2020-04-25 NOTE — Op Note (Signed)
PRE-OPERATIVE DIAGNOSIS:   Morbid obesity  Paraesophageal hernia   GERD  HTN  Hyperlipidemia  Hypothyroidism    POST-OPERATIVE DIAGNOSIS:   same    PROCEDURE:   1. Laparoscopic paraesophageal repair with bio a mesh  2. Laparoscopic Roux-en-Y gastric bypass   3.  Upper endoscopy        ATTENDING SURGEON: Luan Pulling, MD, FACS, FASMBS    ASSISTANT:Gabrielle Unknown Jim, MD fellow, Vernona Rieger MD resident     ANESTHETIC: General     ESTIMATED BLOOD LOSS: <10 cc    SURGICAL FINDINGS:  Extensive intraabdominal fat, GJ constructed using 23mm purple load, Large paraesophageal hernia was present formal repair with performed with high mediastinal dissection and posterior closure of hiatus. Endoscopy showed normal Roux-en-Y anatomy, widely patent GJ anastomosis, good hemostasis, leak test was performed and was negative for leak.     SPECIMENS:    Tip of Roux limb (jejunum)    COMPLICATIONS: None    DRAINS: None    CONDITION ON ARRIVE IN PACU: Stable      Wound Classification: II    Indications: KARIA EHRESMAN is a morbidly obese (BMI Body mass index is 41.4 kg/m.) patient with   multiple comorbid conditions and a large paraesophageal hernia and GERD who underwent extensive multidisciplinary   evaluation and was determined to be at an acceptable risk for bariatric surgery.  In clinic we discussed alternatives, risks, and benefits of the operation which included but are not limited to failure to lose weight or weight regain, bleeding, infection, damage to surrounding structure, need for further procedures, anastomotic leak or staple line leak, return to OR, DVT, PE, MI, stroke and death were all discussed with patient. All questions were answered, all non-surgical options were discussed and the patient signed informed consent.       Procedure in detail:  The patient was taken to the operating room. The patient was placed in supine position with arms out. A time-out was completed  verifying correct patient, procedure,  site, positioning, and implant(s) and/or special equipment prior to   beginning this procedure. After induction of general anesthesia, the patient's abdomen was prepped and draped in  standard sterile fashion.  Appropriate antibiotics were administered at the appropriate time interval and subcutaneous   heparin was also administered prior to intubation.     Left upper quadrant incision was made about 15cm from xiphoid and Veress needle was inserted and abdomen was insufflated. A 5 mm port was placed in this position.  This was followed by placing 5 mm ports x3, and another 12 mm port in the usual gastric bypass position.      A large PEH was noted where more than half the stomach was in herniated in the hiatus.  We began by first reducing the contents of the hiatal hernia by gentle retraction of the stomach.  The gastrophrenic ligament was incised.  Short gastric were then taken down with ligasure device to mobilizing the most superior aspect of the greater curvature.  We took this all the way to the left crus.  Once at the left crus, we then began our dissection at the base of the left crus and the phrenoesophageal ligament was incised.   This was taken up on the right side anteriorly and to the base of the left crus.  We then turned our attention to the right side, we incised the gastrohepatic ligament to the level of the right crus.  Gentle retraction of sac was preformed  and the hiatal dissection from the left side was continued to the base of the right crus and continued on the left side to meet the left sided dissection at the base of the crus.  Once we incised the hernia sac circumferentially, the esophagus was mobilized circumfentially deep into the mediastinum.  We continued dissection until at least 3cm of intra-abdominal esophagus was achieved.  The anterior and posterior vagus were identified and preserved.  The crus was then reapproximated without tension.  BioA mesh was the used to reinforce the  closure.     The left lobe of the liver was retracted. The gastrophrenic ligament over the left crus was incised.  Dissection was started and a window was made perigastric on lesser curcature of the stomach about 7cm from the GEJ and the lesser sac was entered.  Once retrogastric the inferior wall of the gastric pouch was formed with a single firing of the 26mm purple load at about 5-6cm from GEJ.  The stomach was then retracted and two other firings of a purple 28mm load were used to create the lateral wall of the gastric pouch.  A gastrotomy was made at the middle portion of the inferior staple line of the gastric pouch.     Transverse mesocolon was retracted and ligament of Trietz was localized.  The bowel was run 50cm and transected with 72mm tan load.   An enterotomy was made at about 15cm from the tip of the Roux limb and brought up to the gastrotomy and a side to side functional end to end gastrojejunostomy was constructed.  The enterotomy was closed with two layer of 2-0 Polysorb. The bowel was then run 120cm distal from the [place] anastomosis and an end-to-end jejuno-jejunostomy anastomosis was constructed using a 60 mm tan load.  The enterotomy was closed with 2-0 Polysorb in a two layer fashion.     The jejuno-jejunostomy mesenteric defect and Petersen's defect were then closed using 2-0 Surgidec in a running fashion.     The tip of the roux limb was trimmed to health bowel about 1cm from GJ anastomosis staple line.     An upper endoscopy was preformed showing widely patent anastomosis, no leak on air-water leak test.     The midline 30mm trocar was then removed and facia closed with and Endoclose.  The rest of the radial dilating ports were removed.  The skin incisions were closed using 4-0 Monocryl and Dermabond was applied.    The patient tolerated the procedure well and was taken to the postanesthesia care unit in stable condition.      ATTENDING ATTESTATION:   I Gus Rankin, MD was present,  scrubbed, and participated in all aspects of the operation.     Dr. Unknown Jim was scrubbed as there was no adequate level resident available to assist.

## 2020-04-25 NOTE — Anesthesia Procedure Notes (Signed)
Intubation    Date/Time: 04/25/2020 1:50 PM  Performed by: Biagio Quint, CRNA  Authorized by: Larna Daughters, MD     Procedure Details:     Pre-oxygenated: Yes      Induction:  IV and RSI    Blade:  Mac 3    View Grade:  1    Tube type:  Oral cuffed    Tube size (mm):  7.0    Tube Secured at (cm):  20    Cricoid pressure: Yes      Stylet: Yes      Bougie Used: No      Dental damage: No      Lip/Other damage: No      Bite block inserted:  None    ET-CO2 present: Yes      Breath sounds equal: Yes      Eyes taped: Yes

## 2020-04-25 NOTE — H&P (Cosign Needed)
GI Surgery   History & Physical     Date of service:   04/25/2020  Attending:  Stan Head*   Referring Provider:   Subjective    Chief Complaint: Morbid obesity    History of Present Illness:  Brandi Hart is a 54 year old female with OSA, HLD, HTN and GERD presenting for laparoscopic paraesophageal hernia repair, Roux en Y gastric bypass, and upper GI. She has made multiple non-surgical attempts at weight loss over the last several years without success at achieving sustained, significant weight loss.        Review of Systems:  Constitutional: no fevers, no chills, no night sweats  Neurologic: no numbness, no tingling, no weakness  ENT: no eye discharge, no vision changes, no throat pain  Cardiovascular: no chest pain, no palpitations  Respiratory: no increased work of breathing, no shortness of breath  GI: no abdominal pain, no changes in bowel movements, no melena  GU: no dysuria, no urinary incontinence    Past Medical History:  Past Medical History:   Diagnosis Date   . Anemia    . Congenital kidney disease    . Coronary artery disease    . Fibroids    . Gastritis    . GI bleed    . Hypertension    . Hypothyroidism        Past Surgical History:  Past Surgical History:   Procedure Laterality Date   . CESAREAN SECTION, LOW TRANSVERSE     . ENDOMETRIAL ABLATION      x2   . TUBAL LIGATION      at time of C-section       Adverse Drugs Reactions:  No Known Allergies    Home Medications:  Prior to Admission medications    Medication Sig Start Date End Date Taking? Authorizing Provider   ACETAMINOPHEN EXTRA STRENGTH 500 MG tablet TAKE 1 TABLET BY MOUTH EVERY 4 TO 6 HOURS AS NEEDED 06/20/19   Historical, Documentation   acetaminophen-codeine (TYLENOL #3) 300-30 MG tablet TK 1/2 - 1 T PO Q 6 H PRN P 03/28/19   Historical, Documentation   acyclovir (ZOVIRAX) 400 MG tablet Take 400 mg by mouth 3 times daily. 06/16/19   Historical, Documentation   albuterol 108 (90 Base) MCG/ACT inhaler INHALE 2 PUFFS PO Q 4  H FOR 90 DAYS 03/23/19  Yes Historical, Documentation   amlodipine-benazepril (LOTREL) 10-40 MG capsule TK ONE C PO QD 02/20/19  Yes Historical, Documentation   atorvastatin (LIPITOR) 40 MG tablet TK 1 T PO QD 04/19/19   Historical, Documentation   Blood Pressure Monitor/Arm DEVI U UTD 05/17/19   Historical, Documentation   famotidine (PEPCID) 20 MG tablet TK 1 T PO QHS PRN 02/10/19  Yes Historical, Documentation   FLOVENT HFA 110 MCG/ACT inhaler INL 2 PFS PO BID 03/15/19   Historical, Documentation   folic acid (FOLVITE) 1 MG tablet Take 1 mg by mouth daily. 06/29/19   Historical, Documentation   furosemide (LASIX) 40 MG tablet TK 1 T PO  D PRN 02/04/19   Historical, Documentation   HYDROcodone-acetaminophen (NORCO) 5-325 MG tablet Take 1 tablet by mouth every 6 hours as needed for Moderate Pain (Pain Score 4-6).  05/03/19   Historical, Documentation   levothyroxine (SYNTHROID) 125 MCG tablet Take 125 mcg by mouth every morning (before breakfast).   Yes Historical, Documentation   loratadine (CLARITIN) 10 MG tablet Take 10 mg by mouth as needed (ALLERGIES).  06/27/19   Historical,  Documentation   metoprolol tartrate (LOPRESSOR) 25 MG tablet Take 25 mg by mouth 2 times daily. 02/04/19  Yes Historical, Documentation   ondansetron (ZOFRAN ODT) 4 MG disintegrating tablet Take 4 mg by mouth every 6 hours as needed for Nausea/Vomiting.  04/25/19   Historical, Documentation   pantoprazole (PROTONIX) 40 MG tablet TK 1 T PO DAILY 03/18/19  Yes Historical, Documentation   predniSONE (DELTASONE) 20 MG tablet Take 20 mg by mouth 2 times daily. 06/16/19   Historical, Documentation   traZODone (DESYREL) 50 MG tablet Take 50 mg by mouth at bedtime. 06/20/19  Yes Historical, Documentation   venlafaxine (EFFEXOR XR) 37.5 MG XR capsule Take 37.5 mg by mouth daily.  04/29/19  Yes Historical, Documentation   VITAMIN D3 25 MCG (1000 UT) capsule Take 1 capsule by mouth daily.  02/23/19  Yes Historical, Documentation       Social  History:  Reviewed, otherwise noncontributory except as stated in HPI.  Social History     Tobacco Use   . Smoking status: Never Smoker   . Smokeless tobacco: Never Used   Substance Use Topics   . Alcohol use: Never   . Drug use: Never        Family History:  Reviewed, otherwise noncontributory except as stated in HPI.      Objective   Vitals:     There is no height or weight on file to calculate BMI.    PHYSICAL EXAM  GENERAL: NAD, non-toxic, atraumatic  NEURO: No gross abnormalities, grossly intact, able to move all 4 extremities, GCS 15  HEENT: Normocephalic, atraumatic, no scleral icterus or conjunctival pallor, mucus membranes moist  NECK: Supple, non-tender  CV: rate and rhythm, no chest wall tenderness,   PULM: Normal work of breathing, equal chest rise   ABDOMEN: soft, non-distended, non-tender to palpation. No rebound or guarding, non-peritonitic. Central obesity  GU: Deferred  SKIN: warm and dry, no rashes, no jaundice  EXTREMITIES: no edema, warm to touch, well perfused     Laboratory:      Imaging:  Upper GI was completed and reviewed and showed6 cm hiatal hernia.  -The Z line was irregular and was found 34 cm from the incisors. A 6 cm hiatal hernia was found. The proximal extent of the gastric folds (end of tubular esophagus) was 36 cm from the incisors. The hiatal narrowing was 40 cm from the incisors The Z line was 34 cm from the incisors.    Other labs and imaging reviewed. Pertinents mentioned in assessment/plan.    Assessment/Plan:   Brandi Hart is a 54 yo female presenting for preoperative visit for laparoscopic paraesophageal hernia repair, Roux en Y gastric bypass, and Upper GI. Patient has completed all necessary preoperative testing including ECHO. Patient would like to proceed with surgery.    - OR for laparoscopic paraesophageal hernia repair, Roux en Y gastric bypass, and Upper endoscopy      Outpatient Providers:  PCP: El-Hajjaoui, Caralyn Guile. Dellie Burns, MD  Resident  Physician, PGY-1  Department of General Surgery

## 2020-04-25 NOTE — Plan of Care (Signed)
Problem: Promotion of Perioperative Health and Safety  Goal: Promotion of Health and Safety of the Perioperative Patient  Description: The patient remains safe, receives treatment appropriate to the surgical intervention and patient's physiological needs and is discharged or transferred to the appropriate level of care.  04/25/2020 1922 by Anastasia Pall, RN  Outcome: Resolved  Flowsheets (Taken 04/25/2020 1922)  Standard of Care: PACU [facility]  Outcome Evaluation (rationale for progressing/not progessing: Maintained patient's safety and acceptable level of pain  04/25/2020 1813 by Anastasia Pall, RN  Outcome: Progressing  Flowsheets (Taken 04/25/2020 1800)  Standard of Care: PACU [facility]

## 2020-04-25 NOTE — Event / Update (Signed)
POST OP CHECK:     Patient: Brandi Hart  DOB: 1966/04/10  AGE: 54 year old  Sex: female    Date of Surgery:   04/25/2020    Subjective:   Patient tolerated procedure well. Resting comfortably. Pain controlled with current pain regimen. No nausea or vomiting. Denies shortness of breath or chest pain. Has not voided yet after foley removal    Objective:   Vitals: BP 135/65 (BP Location: Right arm, BP Patient Position: Semi-Fowlers)    Pulse 96    Temp 97.5 F (36.4 C)    Resp 18    Wt 106 kg (233 lb 11 oz)    SpO2 100%    BMI 41.40 kg/m     Physical Exam:  General: No acute distres   Neuro: GCS15, Alert and Oriented x4, moving all 4  HEENT: Normocephalic and atraumatic. Sclera anicteric.    Cardio: Regular rate and rhythm   Resp: Normal respiratory effort on room air   Abdomen: Soft, non-tender, non-distended, no guarding, no focal tenderness, non-peritonitic, non-tympanitic. Surgical incisions clean, dry, and intact. Edges well approximated and without erythema.  Extremities: Warm and well perfused, no edema, no cyanosis     Assessment/Plan  Brandi Hart is a 54 yo female presenting s/p laparoscopic paraesophageal hernia repair, Roux en Y gastric bypass.    -Follow up void  - Pain management: Morphine PRN, Tylenol, Oxy PRN, Tramadol, Gabapentin  - Nausea management: Phenergan, Zofran  - Bowel regimen: Colace  - Primary team will assess patient again in AM    Versie Starks M.D., PGY-1  Department of General Surgery

## 2020-04-25 NOTE — Brief Op Note (Signed)
BRIEF OPERATIVE NOTE    DATE: 04/25/2020  TIME: 5:16 PM    PREOPERATIVE DIAGNOSIS: Obesity and Hiatal Hernia    POSTOPERATIVE DIAGNOSIS: Same     PROCEDURE INFORMATION:  Procedure(s):  T2 AM LAPAROSCOPIC PARAESOPHAGEAL HERNIA REPAIR,  ROUX - EN -Y GASTRIC BYPASS, UPPER ENDOSCOPY    ATTENDING SURGEON:   Surgeon(s) and Role:     * Hinojosa, Rosendo Gros, MD - Primary     * Pete Pelt, DO - Resident - Assisting     * Milbert Coulter, MD - Fellow    ANESTHESIA: General    FINDINGS: Large hiatal hernia. Roux en Y gastric bypass performed with roughly 5cm gastric pouch and all mesenteric defects closed. EGD performed showing no narrowing at hiatal closure, 5cm long pouch and easy passage of scope through [place] anastomosis.    SPECIMENS: small bowel tip of roux    Fluids/Blood Products:      IV Fluids: 857mL    Blood Products: None    EBL: 74mL    Urine Output: 0    COMPLICATIONS: None    DISPOSITION: To PACU with normal, stable vital signs

## 2020-04-25 NOTE — Plan of Care (Signed)
Problem: Promotion of Perioperative Health and Safety  Goal: Promotion of Health and Safety of the Perioperative Patient  Description: The patient remains safe, receives treatment appropriate to the surgical intervention and patient's physiological needs and is discharged or transferred to the appropriate level of care.  Outcome: Progressing  Flowsheets (Taken 04/25/2020 1800)  Standard of Care: PACU [facility]

## 2020-04-26 LAB — BASIC METABOLIC PANEL, BLOOD
BUN: 12 mg/dL (ref 7–25)
CO2: 23 mmol/L (ref 21–31)
Calcium: 8.2 mg/dL — ABNORMAL LOW (ref 8.6–10.3)
Chloride: 102 mmol/L (ref 98–107)
Creat: 0.6 mg/dL (ref 0.6–1.2)
Electrolyte Balance: 12 mmol/L (ref 2–12)
Glucose: 128 mg/dL — ABNORMAL HIGH (ref 85–125)
Potassium: 3.2 mmol/L — ABNORMAL LOW (ref 3.5–5.1)
Sodium: 137 mmol/L (ref 136–145)
eGFR - high estimate: >60 (ref >59)
eGFR - low estimate: >60 (ref >59)

## 2020-04-26 LAB — HEMOGRAM, BLOOD
Hematocrit: 36 % (ref 34.0–44.0)
Hgb: 11.5 G/DL (ref 11.5–15.0)
MCH: 25.1 PG — ABNORMAL LOW (ref 27.0–33.5)
MCHC: 32 G/DL (ref 32.0–35.5)
MCV: 78.4 FL — ABNORMAL LOW (ref 81.5–97.0)
MPV: 10.2 FL (ref 7.2–11.7)
PLT Count: 238 10*3/uL (ref 150–400)
RBC: 4.59 10*6/uL (ref 3.70–5.00)
RDW-CV: 17.6 % — ABNORMAL HIGH (ref 11.6–14.4)
White Bld Cell Count: 16.7 10*3/uL — ABNORMAL HIGH (ref 4.0–10.5)

## 2020-04-26 LAB — PHOSPHORUS, BLOOD: Phosphorus: 3.8 MG/DL (ref 2.5–5.0)

## 2020-04-26 LAB — MAGNESIUM, BLOOD: Magnesium: 1.5 mg/dL — ABNORMAL LOW (ref 1.9–2.7)

## 2020-04-26 MED ORDER — OXYCODONE HCL 5 MG OR TABS
5.0000 mg | ORAL_TABLET | ORAL | 0 refills | Status: DC | PRN
Start: 2020-04-26 — End: 2020-12-18

## 2020-04-26 MED ORDER — POTASSIUM CHLORIDE 20 MEQ OR PACK
40.0000 meq | PACK | Freq: Once | ORAL | Status: AC
Start: 2020-04-26 — End: 2020-04-26
  Administered 2020-04-26: 40 meq via ORAL

## 2020-04-26 MED ORDER — ACETAMINOPHEN 325 MG PO TABS
975.0000 mg | ORAL_TABLET | Freq: Three times a day (TID) | ORAL | Status: DC
Start: 2020-04-26 — End: 2020-04-27
  Administered 2020-04-26 – 2020-04-27 (×6): 975 mg via ORAL
  Filled 2020-04-26 (×5): qty 3

## 2020-04-26 MED ORDER — MAGNESIUM SULFATE 2 GM/50ML IV SOLN
2.0000 g | Freq: Once | INTRAVENOUS | Status: AC
Start: 2020-04-26 — End: 2020-04-26
  Administered 2020-04-26 (×2): 2 g via INTRAVENOUS
  Filled 2020-04-26: qty 50

## 2020-04-26 NOTE — Discharge Instructions (Signed)
Discharge Instructions:   ?   Medications: ?   - You may take Tylenol (acetaminophen) for pain control  Up to 1000mg  every 6-8 hours. Do NOT take more than 4g of Tylenol in a 24-hour period.  - You may take ibuprofen for pain control, up to 600mg  every 6-8 hours as needed for pain.   - You may also take oxycodone in addition to Tylenol and ibuprofen as prescribed for pain  - If you're taking narcotics (oxycodone) for pain, please do not drive or operate machinery while on these medications.   - Please take antibiotics as prescribed to prevent post-operative infections.   ?   Wound care:   - You may shower daily after your surgery. Do not tub bathe. No swimming until seen at your follow up appointment.   - Allow soapy water to run over the incisions. Do not scrub.   - Pat dry with a towel, do not rub.  - Your incisions are covered with dermabond skin glue which will fall off naturally in 2-3 weeks.       Diet:   - Please follow a full liquid diet for 2 weeks after surgery.  Suggested Choices for a Full Liquid Diet After Surgery:   . Beverages   . Skim milk with protein powder   . Gatorade, Broth, Water  . Meal replacement drinks  . Protein Supplements   . Begin using protein supplements after each meal to meet your daily protein requirements.  . Others   . Fat free yogurt (no added sugar)   . Strained cream soups     - Remember, your stomach is still recovering from surgery. It's common to experience minor nausea. Please keep yourself hydrated.  - Drink plenty of fluids to avoid dehydration  - If you experience constipation, please take a stool softener or laxative to help aid in your bowel movements     Activity:   - Avoid lifting more than 10 to 15 pounds for 8 weeks after surgery   - No driving while taking narcotic pain medication   - Please walk as often as you are able to - at least approximately 30 minutes a day.   - Do the coughing and breathing exercises you were taught in the hospital. If you were given an  incentive spirometer to help with breathing, use it as directed. This is important and will help prevent lung infections.   - Ask your healthcare provider when you can return to work at your follow up appointment   ?   Follow up:   Your Appointments    Tuesday May 08, 2020  3:30 PM  POST OP with Gus Rankin, MD  [facility] [address]  [place] [address]  [phone]             - Please attend your post operative follow up appointment.  - If it has not been scheduled yet, please call the clinic and schedule a post operative follow up appointment in 1 to 2 weeks.     - Please attend your post operative follow up appointment.  - If it has not been scheduled yet, please call the clinic and schedule a post operative follow up appointment in 1 to 2 weeks.   ?   Please call the hospital or clinic OR go to the emergency room if you experience:   - Fevers greater than 100.73F or chills   -  Increased pain, redness, swelling, bleeding, or foul-smelling drainage at the incision site   - Incision separates or comes apart   - Increasing nausea and/or vomiting   - Worsening abdominal pain or isn't relieved by pain medicine   - Inability to have a bowel movement or pass gas in 3 days   - Dark-colored or bloody urine   - Bright red or dark black stools   - New onset of chest pain or shortness of breath

## 2020-04-26 NOTE — Plan of Care (Signed)
Problem: Promotion of health and safety  Goal: Promotion of Health and Safety  Description: The patient remains safe, receives appropriate treatment and achieves optimal outcomes (physically, psychosocially, and spiritually) within the limitations of the disease process by discharge.  04/26/2020 1916 by Jorene Guest, RN  Outcome: Progressing  Flowsheets  Taken 04/26/2020 1916  Standard of Care/Policy:   Medical/Surgical   Skin   Falls Reduction  Outcome Evaluation (rationale for progressing/not progressing) every shift: A&OX4, VSS, 4 abd lapsites DCI w/ DB. Pain controlled w/ scheduled & PRN meds. N/V controlled w/ Zofran around the clock. Tolerating Bariatric feedings. OOB SBA w/ walker. Voiding spontaneously.  Individualized Interventions/Recommendations (Discharge Readiness): Pt has not met milestones for DC today  Taken 04/26/2020 1542  Individualized Interventions/Recommendations (Skin/Comfort/Safety/Mobility): [place] abd lapsites for bleeding or hematoma. ecourage pt to ambulate. encourage pt to self turn as needed for comfort.  Individualized Interventions/Recommendations (Knowledge): Update pt w/ POC  Taken 04/26/2020 0728  Patient /Family stated Goal: rest  04/26/2020 1542 by Jorene Guest, RN  Flowsheets (Taken 04/26/2020 1542)  Individualized Interventions/Recommendations (Skin/Comfort/Safety/Mobility): [place] abd lapsites for bleeding or hematoma. ecourage pt to ambulate. encourage pt to self turn as needed for comfort.  Individualized Interventions/Recommendations (Knowledge): Update pt w/ POC   Nursing Shift Summary    Shift Comments for the past 12 hrs:   Comments   04/26/20 0728 recieved pt from PM nurse. A&Ox4, VSS, 4 abd lapsites w/ dermabond. call light & overbed table within reach.

## 2020-04-26 NOTE — Progress Notes (Cosign Needed)
PROGRESS NOTE  Bariatric/GI SURGERY      Date of service:  04/26/2020   Admission Date: 04/25/2020 Attending:  [facility] Day:   1 day - Admitted on: 04/25/2020    SUBJECTIVE / 24 HOUR EVENTS   NAEON  -VSS, afebrile  -Pain moderately well controlled on current regimen.   -Tolerating clear liquid diet   - Persistent nausea with spitting up despite ATC anti nausea medication  -Passing flatus, no BM.   -Voiding spontaneously; clear, yellow in color; non-bloody.   -Ambulating without difficulty.   -Denies vomiting, chest pain, shortness of breath, fevers/chills    OBJECTIVE   VITAL SIGNS:  Temperature:  [97.3 F (36.3 C)-99 F (37.2 C)] 97.9 F (36.6 C)  Heart Rate:  [71-101] 71  Respirations:  [16-25] 16  Blood pressure (BP): (113-145)/(59-73) 114/59  SpO2:  [88 %-100 %] 94 %    10/20 0600 - 10/21 0559  In: 2531.3 [P.O.:425; I.V.:2106.3]  Out: 2035 [Urine:2035]    PHYSICAL EXAM  GENERAL: NAD, non-toxic, atraumatic  NEURO: No gross abnormalities, grossly intact, able to move all 4 extremities, GCS 15  HEENT: Normocephalic, atraumatic, no scleral icterus or conjunctival pallor, mild swelling and erythema around eyes  NECK: Supple  CV: Regular rate  PULM: Normal work of breathing, equal chest rise   ABDOMEN: soft, non-distended, non-tender to palpation. No rebound or guarding, non-peritonitic. Incision sites clean, dry, intact.   SKIN: warm and dry, intact without jaundice. Mild erythema and swelling around eyes  EXTREMITIES: No lower extremity edema, warm to touch, well perfused       NEW DIAGNOSTICS  LABS:   CBC  Recent Labs     04/26/20  0512   WBCCOUNT 16.7*   RBC 4.59   HGB 11.5   HCT 36.0   MCV 78.4*   MCH 25.1*   MCHC 32.0   RDW 17.6*   MPVH 10.2   PLT 238     Chemistry  Recent Labs     04/26/20  0512   SODIUM 137   K 3.2*   CL 102   CO2 23   BUN 12   CREAT 0.6   GLU 128*   [place] 8.2*   MG 1.5*   PHOS 3.8     ABG  ABG: pH  PCO2   PO2   FiO2         Liver Function Panel  No results  for input(s): ALK, AST, ALT, TBILI, DBILI, ALB in the last 72 hours.  Coags  No results for input(s): PROTIME, PTT, INR in the last 72 hours.  Lactate   No results for input(s): LACT in the last 72 hours.  Micro  Lab Results   Component Value Date    SDES VAGINA 02/07/2019    SDES SWAB VAGINA 02/07/2019    CULT 3+  NORMAL VAGINAL FLORA   02/07/2019    CULT 2+ YEAST (A) 02/07/2019    GS FEW  EPITHELIAL CELLS   02/07/2019    GS 2+  GRAM POSITIVE RODS   02/07/2019    GS FEW  BUDDING YEAST   02/07/2019         IMAGING: No results found.     MEDICATION SUMMARY  Scheduled:  . acetaminophen  975 mg Q8H   . acyclovir  400 mg TID   . albuterol  2 puff Q4H   . atorvastatin  40 mg QPM   . docusate sodium  250 mg BID   .  fluticasone  2 puff Q12H   . gabapentin  300 mg Q8H   . heparin  5,000 Units Q8H   . levothyroxine  125 mcg QAM AC   . metoprolol tartrate  25 mg BID   . ondansetron  4 mg Q6H   . traMADol  50 mg Q6H   . traZODone  50 mg HS   . venlafaxine  37.5 mg Daily     PRN:  . oxyCODONE  10 mg Q6H PRN   . oxyCODONE  5 mg Q4H PRN   . promethazine (PHENERGAN) IVPB  12.5 mg Q4H PRN     Infusions:  . sodium chloride 125 mL/hr at 04/26/20 2263       Other labs and imaging reviewed. Pertinents mentioned in assessment/plan.    ASSESSMENT AND PLAN   54 year old female with hx obesity, OSA, HLD, HTN, and GERD now status post lap paraesophageal hernia repair and RnY gastric bypass.    1 Day Post-Op        1 day - Admitted on: 04/25/2020    #Obesity:  - Status post laparoscopic RnY gastric bypass  -Abx: acyclovir   - Continue clear liquid diet. Pt with persistent nausea despite around the clock zofran  - will keep in house one more day for monitoring of nausea and slow advancement of diet    #GERD:  - Status post laparoscopic paraesophageal hernia repair  - Denies symptoms of reflux and regurgitation    #Pain:   PRN: .  acetaminophen, 975 mg, Oral  .  oxyCODONE, 10 mg, Oral  .  oxyCODONE, 5 mg, Oral  .  traMADol, 50 mg, Oral      #Hypophosphatemia, hypokalemia, hypomagnesemia:  -Replete PRN    #Hypercholesterolemia  - Continue home dose of Atorvastatin     #Hypothyroidism  - Continue home dose of Levothyroxine     #Hypertension  - Continue home dose of Metoprolol tartrate    #Hospital Issues  -Encourage incentive spirometer, ambulation, O2 as necessary to maintain sat >90%  -Diet: Diet Order Therapeutic; Bariatric (6 small feedings), Clear Liquid; No Concentrated Sweets   -VTE ppx: SCDs, heparin-5,000 Units      Disposition: To home when meeting all appropriate discharge milestones  Code Status: Full Code    Theola Sequin, MS4    I have read and edited the medical students note and agree with the assessment and plan    Furkan G. Dellie Burns, MD  Resident Physician, PGY-1  Department of General Surgery          Required Daily Device Assessment:  This patient does not have a documented central line or urinary catheter.

## 2020-04-26 NOTE — Plan of Care (Signed)
Problem: Promotion of health and safety  Goal: Promotion of Health and Safety  Description: The patient remains safe, receives appropriate treatment and achieves optimal outcomes (physically, psychosocially, and spiritually) within the limitations of the disease process by discharge.  Outcome: Progressing  Flowsheets (Taken 04/26/2020 0452)  Standard of Care/Policy: Medical/Surgical  Outcome Evaluation (rationale for progressing/not progressing) every shift: VSS  Individualized Interventions/Recommendations (Skin/Comfort/Safety/Mobility): Call light usages and early mobility exercise.  Individualized Interventions/Recommendations (Knowledge): PRN medications and diet.  Note: Nursing Shift Summary      Shift Comments for the past 12 hrs:   Comments Observations   04/25/20 1752 -- in PACU   04/25/20 1835 -- Daughter at [facility]   04/25/20 1900 -- SBAR given to [place], [place]   50/56/97 9480 Patient arrived PPCU #7 from [place] via [place]. --   04/25/20 1936 -- PPCU - [facility] ([facility])   04/25/20 2030 Educate patient on schedule/ PRN medications, diet and mobility exercise; patient received info. well.  --

## 2020-04-26 NOTE — Anesthesia Postprocedure Evaluation (Signed)
Anesthesia Post Note    Patient: Brandi Hart    Procedure(s) Performed: Procedure(s):  T2 AM LAPAROSCOPIC PARAESOPHAGEAL HERNIA REPAIR with biologic mesh,  ROUX - EN -Y GASTRIC BYPASS, UPPER ENDOSCOPY      Final anesthesia type: General     Patient location: PACU    Post anesthesia pain: adequate analgesia    Mental status: awake, alert  and oriented    Airway Patent: Yes    Last Vitals:    Vitals Value Taken Time   BP 134/65 04/25/20 1900   Temp 36.3 C 04/25/20 1815   Pulse 99 04/25/20 1900   Resp 19 04/25/20 1900   SpO2 100 % 04/25/20 1900        Temperature >35.5: Yes    Post vital signs: stable    Hydration: adequate    N/V:no    Plan of care per primary team.      04/26/2020: Eye swelling decreased from yesterday. Still without erythema but mildly tender. Pt also agrees that swelling improved. Will follow-up

## 2020-04-27 ENCOUNTER — Inpatient Hospital Stay: Payer: Medicaid Other

## 2020-04-27 ENCOUNTER — Encounter: Payer: Self-pay | Admitting: Surgery

## 2020-04-27 MED ORDER — ENOXAPARIN SODIUM 40 MG/0.4ML SC SOLN
40.0000 mg | Freq: Every day | SUBCUTANEOUS | 0 refills | Status: AC
Start: 2020-04-27 — End: ?

## 2020-04-27 NOTE — Plan of Care (Signed)
Problem: Promotion of health and safety  Goal: Promotion of Health and Safety  Description: The patient remains safe, receives appropriate treatment and achieves optimal outcomes (physically, psychosocially, and spiritually) within the limitations of the disease process by discharge.  Outcome: Progressing  Flowsheets  Taken 04/27/2020 0438  Outcome Evaluation (rationale for progressing/not progressing) every shift: VSS, afebrile, no bleeding from abd'l lap sites, pain managed with Oxycodone, ON ATC Zofran to control n/v.  Individualized Interventions/Recommendations (Discharge Readiness): Pt ambulatory, voiding spontaneously, tolerating clear liquid diet, denies any n/v.  Individualized Interventions/Recommendations (Skin/Comfort/Safety/Mobility): Monitor lap sites for bleeding, CHG bath done, needs attended and met, fall / safety measures maintained.  Individualized Interventions/Recommendations (Knowledge): Pt aware of milestones to be met prior to discharge.  Individualized Interventions/Recommendations: Monitor for pain and n/v and medicate with PRN med as ordered.  Individualized Interventions/Recommendations: Encourage ambulation and use of IS.  Taken 04/26/2020 1922  Patient /Family stated Goal: to go home

## 2020-04-27 NOTE — Interdisciplinary (Signed)
Physical Therapy Evaluation and Discharge    Admitting Physician:  Stan Head*  Admission Date 04/25/2020    Inpatient Diagnosis:   Problem List       Codes    Impaired gait and mobility     ICD-10-CM: R26.89  ICD-9-CM: 781.2          IP Start of Service   Start of Care: 04/27/20    Preferred Language:Spanish         Past Medical History:   Diagnosis Date   . Anemia    . Congenital kidney disease    . Coronary artery disease    . Fibroids    . Gastritis    . GI bleed    . Hypertension    . Hypothyroidism       Past Surgical History:   Procedure Laterality Date   . CESAREAN SECTION, LOW TRANSVERSE     . ENDOMETRIAL ABLATION      x2   . TUBAL LIGATION      at time of C-section        PT Acute     Row Name 04/27/20 1100          Type of Visit    Type of Physical Therapy note Physical Therapy Evaluation and Discharge     Row Name 04/27/20 1100          Treatment Precautions/Restrictions    Precautions/Restrictions Fall     Fall Socks/charm     Row Name 04/27/20 1100          Medical History    History of presenting condition Per chart: 54 year old female with hx obesity, OSA, HLD, HTN, and GERD now status post lap paraesophageal hernia repair and RnY gastric bypass.     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      Patient  status Nursing in agreement for treatment;Patient agreeable to treatment     Row Name 04/27/20 1100          Pain Assessment    Pain Asssessment Tool Numeric Pain Rating Scale     Row Name 04/27/20 1100          Numeric Pain Rating Scale    Pain Intensity - rating at present 6     Pain Intensity- rating after treatment 6     Location abdomen      Row Name 04/27/20 1100          Objective    Overall Cognitive Status Intact - no  cognitive limitations or impairments noted     Balance Balance limitations present     Static Sitting Balance Normal - able to maintain steady balance without handhold support     Dynamic Sitting Balance Good - accepts moderate challenge, able to maintain balance while picking object off floor     Static Standing Balance Good - able to maintain balance without handhold support, limited postural sway     Dynamic Standing Balance Fair - accepts minimal challenge, able to maintain balance while turning head/trunk     Extremity Assessment Flexibility, strength, muscle tone and sensation grossly within functional limits throughout     Functional Mobility Functional mobility deficits present     Bed Mobility Supervised     Bed Mobility Comments HOB flat, able to partially roll      Transfers to/from Stand Supervised     Gait Supervised     Gait Comments waddling gait with slower pace, no unsteadiness noted during walk or turns, dizziness present through out     Device used for ambulation/mobility None     Ambulation Distance 100 ft     Other Objective Findings Pt did not want to ambulate much due to doing a big walk in morning with doctor, pt has dizizness upon sitting and in standing which did not improve, BP stable, RN aware                      Eval cont.     [place] Name 04/27/20 1100          [place] AM-PAC: Basic Mobility    Assistance Needed to Turn from Back to Side While in a Flat Bed Without Using Bedrails 4 - None (independent)     Difficulty with Supine to Sit Transfer 3 - A little  (supervised/min assist)     How Much Help Needed to Move to/from Bed to Chair 3 - A little (supervised/min assist)     Difficulty with Sit to Stand Transfer from Chair with Arms 3 - A little (supervised/min assist)     How Much Help Needed to Walk in Room 3 - A little (supervised/min assist)     How Much Help Needed to Climb 3-5 Steps with a Rail 3 - A little (supervised/min assist)     AMPAC Total Score 19     Assessment: AM-PAC Basic Mobility Impairment Rating Score 19-22 - 20-39% impaired     Row Name 04/27/20 1100          Patient/Family Education    Learner(s) Patient     Learner response to rehab patient education interventions Verbalizes understanding     [place] Name 04/27/20 1100          Assessment    Assessment Pt seen for mobility assessment, presented with post surgical abdominal pain and dizziness with mobility, pt is able to transfer in/OOB without hands on assist, able to ambulate without walker with steady slow paced gait. She has family help at home, currently ongoing skilled PT is not indicated. PT will sign off     Row Name 04/27/20 1100          Patient stated Goal    Patient stated goal get better     Row Name 04/27/20 1100          Treatment Plan Disussion    Treatment Plan Discussion and Agreement Patient/family/caregiver stated understanding and agreement with the therapy plan     Row Name 04/27/20 1100  Treatment Plan    Frequency of treatment Patient appropriate for discharge from therapy     Duration of treatment (number of visits) One time only, further treatment not indicated     [place] Name 04/27/20 1100          Patient Safety Considerations    Patient safety considerations Patient returned to bed at end of treatment;Call light left in reach and fall precautions in place;Nursing notified of safety considerations at end of treatment     Patient assistive device requirements for safe ambulation No device required     Other Patient Safety Considerations Information RN Sam aware     [place]  Name 04/27/20 1100          Therapy Plan Communication    Therapy Plan Communication Discussed therapy plan with Nursing and/or Physician;Encouraged out of bed with assistance by     Encouraged out of bed with assistance by Nursing;Staff     Row Name 04/27/20 1100          Type of Eval    Low Complexity (32023) Completed     Row Name 04/27/20 1100          Therapeutic Procedures    Gait Training [phone]) Dynamic activities while walking;Gait pattern analysis and treatment of deviations;Patient education        Total TIMED Treatment (min)  15     Row Name 04/27/20 1100          Treatment Time     Total TIMED Treatment  (min) 30     Total Treatment Time (min) 60     Treatment start time 0830               Post Acute Discharge Recommendations  Discharge Rehabilitation Reccomendations [facility] ONLY): None- patient currently  has no further skilled therapy needs  Equipment recommendations: No equipment needed - patient has own equipment    The physical therapist of record is endorsed by evaluating physical therapist.

## 2020-04-27 NOTE — Plan of Care (Signed)
Problem: Promotion of health and safety  Goal: Promotion of Health and Safety  Description: The patient remains safe, receives appropriate treatment and achieves optimal outcomes (physically, psychosocially, and spiritually) within the limitations of the disease process by discharge.  Outcome: Progressing  Flowsheets (Taken 04/27/2020 1721)  Outcome Evaluation (rationale for progressing/not progressing) every shift: vss, a/ox4, x5 lap sites are clean, dry, and intact. Pain managed with scheduled ultram and n.v managed with scheduled zofran.  Patient /Family stated Goal: to go home.  Individualized Interventions/Recommendations (Discharge Readiness): educated pt. on lap site care and exercises to avoid, will go home by the end of shift. Waiting for her ride.  Individualized Interventions/Recommendations (Skin/Comfort/Safety/Mobility): bed in the lowest position and call light within reach, encourage patient to call for assistance prior to getting out of bed.  Individualized Interventions/Recommendations (Knowledge): monitor lap sites for bleeding, hematoma, or s/s of infection and report to MD

## 2020-04-27 NOTE — Interdisciplinary (Signed)
Occupational Therapy Evaluation and Discharge    Admitting Physician:  Stan Head*  Admission Date 04/25/2020    Inpatient Diagnosis:   Problem List       Codes    Impaired gait and mobility     ICD-10-CM: R26.89  ICD-9-CM: 781.2    Impaired mobility and ADLs     ICD-10-CM: Z74.09, Z78.9  ICD-9-CM: V49.89          IP Start of Service  Start of Care: 04/27/20  Reason for referral: Decline in performance of activities of daily living (ADL)    Preferred Language:Spanish         Past Medical History:   Diagnosis Date    Anemia     Congenital kidney disease     Coronary artery disease     Fibroids     Gastritis     GI bleed     Hypertension     Hypothyroidism       Past Surgical History:   Procedure Laterality Date    CESAREAN SECTION, LOW TRANSVERSE      ENDOMETRIAL ABLATION      x2    TUBAL LIGATION      at time of C-section        OT Acute     Row Name 04/27/20 1600          Type of Visit    Type of Occupational Therapy note Occupational Therapy Evaluation and Discharge     [place] Name 04/27/20 1600          Treatment Time    Treatment Start Time 0700     Total TIMED Treatment (min) 30     Total Treatment Time (min) 72     Row Name 04/27/20 1600          Treatment Precautions/Restrictions    Precautions/Restrictions Fall     Fall Socks/charm     Other Precautions/Restrictions Information IV, tele     Row Name 04/27/20 1600          Medical History    History of presenting condition Pt is a 54 year old female with hx obesity, OSA, HLD, HTN, and GERD now status post lap paraesophageal hernia repair and RnY gastric bypass.     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     Streeter accessibility Stairs present     Number of steps to enter home 1     Number of steps within home 0     Bathroom accessibility Walk in shower     Other Social History Information Pt will have 24 hour assist as needed upon D/C     Row Name 04/27/20 1600          Subjective  Patient status Patient agreeable to treatment;Nursing in agreement for treatment     Row Name 04/27/20 1600          Pain Assessment    Pain Asssessment Tool Numeric Pain Rating Scale     Row Name 04/27/20 1600          Numeric Pain Rating Scale    Pain Intensity - rating at present 6     Pain Intensity- rating after treatment 6     Location abdomen     Row Name 04/27/20 1600          Activities of Daily Living (ADLs)    Self Feeding Supervised     Self Grooming Supervised     Upper Body Dressing Supervised     Lower Body Dressing Supervised     Bathing Supervised     Toileting Supervised     Toilet Transfers Supervised     Row Name 04/27/20 1600          [place] AM-PAC: Daily Activity    Assistance Needed to Put on and Take off Regular Lower Body Clothing 3     Assistance Needed to Bathe, Including Washing, Rinsing, and Drying 3     Assistance Needed to Toilet Environmental manager, Bedpan, or Urinal) 3     Assistance Needed to Put on and Take off Regular Upper Body Clothing 4     Assistance Needed to Take Care of Personal Grooming Such as Brushing Teeth 4     Assistance Needed to Eat Meals 4     AM-PAC Daily Activity Total Score 21     AMP-PAC Daily Activity Impairment rating Score 20-22 - 20-39% impaired     Row Name 04/27/20 1600          Objective    Overall Cognitive Status Intact - no cognitive limitations or impairments noted     Communication No communication limitations or impairments noted. Current status of hearing, speech and vision allow functional  communication.     Coordination/Motor control No limitations or impairments noted. Movement patterns are fluid and coordinated throughout     Balance Balance limitations present     Static Sitting Balance Good - able to maintain balance without handhold support, limited postural sway     Dynamic Sitting Balance Good - accepts moderate challenge, able to maintain balance while picking object off floor     Static Standing Balance Fair - able to maintain balance with handhold support, may require occasional minimal assistance     Dynamic Standing Balance Fair - accepts minimal challenge, able to maintain balance while turning head/trunk     Extremity Assessment Flexibility, strength, muscle tone and sensation grossly within functional limits throughout     Functional Mobility Functional mobility deficits present     Bed Mobility Supervised     Transfers to/from Stand Supervised     Ambulation during functional tasks Supervised     Device used for ambulation/mobility None     Ambulation Distance household distances in hallway     Other Objective Findings Pt educated on use of logroll technique for bed mobility. Pt is S overall for ADL and functional mobility tasks. Educated on energy conservation and pacing with ADL.                 OT Acute Tool Box     Row Name 04/27/20 1600          Cognition Assessment    Overall Cognitive Status Intact -  no cognitive limitations or impairments noted                    Eval cont.     [place] Name 04/27/20 1600          Patient/Family Education    Learner(s) Patient     Learner response to rehab patient education interventions Verbalizes understanding     [place] Name 04/27/20 1600          Assessment    Assessment Pt is S overall for ADL and functional mobility tasks. Pt has good support from family upon D/C home. Discussed home safety and recommendations with Pt demonstrating understanding. Recommend shower chair for safety and to conserve energy. Pt is not in need of skilled OT services.  D/C OT.      [place] Name 04/27/20 1600          Treatment Plan Disussion    Treatment Plan Discussion and Agreement Patient support system determined and all questions were asked and answered;Patient/family/caregiver stated understanding and agreement with the therapy plan     Row Name 04/27/20 1600          Treatment Plan    Duration of treatment (number of visits) One time only, further treatment not indicated     Status of treatment One time only treatment, further skilled therapy not indicated;Patient appropriate for discharge from therapy     [place] Name 04/27/20 1600          Patient Safety Considerations    Patient safety considerations Patient returned to bed at end of treatment;Call light left in reach and fall precautions in place;Patient left  in appropriate pressure relieving position;Patient may be at risk for falls;Nursing notified of safety considerations at end of treatment     Patient assistive device requirements for safe ambulation No device required     Row Name 04/27/20 1600          Post Acute Discharge Recommendations    Discharge Rehabilitation Reccomendations [facility] ONLY) None- patient currently  has no further skilled therapy needs     Equipment recommendations [place] Name 04/27/20 1600          Therapy Plan Communication    Therapy Plan Communication Discussed therapy plan with Nursing and/or Physician     [place] Name 04/27/20 1600          Occupational Therapy Patient Discharge Instructions    Your Occupational Therapist suggests the following Continue to complete your home exercise program daily as instructed;Continue to follow your prescribed mobility precautions when transferring to the chair and toilet as instructed;Continue to complete your self care Activities of Daily Living as frequently as possible;Continue to use energy conservation, pursed lip breathing and self-pacing when completing your self care Activities of Daily Living;Supervision is suggested when you      Supervision is suggested when you bath;dress;toilet     Row Name 04/27/20 1600          Type of Eval    Low Complexity [phone]) Completed     [place] Name 04/27/20 1600          Therapeutic Procedures    Self-Care/ADL Training [phone]) Activities of daily living training;Patient education         Total TIMED Treatment (min) 15                 The occupational therapist of record is endorsed by evaluating occupational therapist.

## 2020-04-27 NOTE — Interdisciplinary (Signed)
04/27/20 1130   Initial Assessment   CM Initial Assessment * Completed   Patient Information   Where was the patient admitted from? * Home   Prior to Level of Function * Ambulatory/Independent with ADL's   Assistive Device * Not applicable   Prior [employer] None   Primary Caretaker(s) * Self;Family;Spouse/Partner   Primary Contact Name, Number and Relationship * Farrel Conners daughter [phone]   Permission to Contact * Yes   Secondary Contact Name, Number and Relationship Juliene Pina daughter [phone]   Income Information   Financial Resources [employer]   Discharge Planning   Living Arrangements * Spouse /Significant Other;Family Member   Available Assistance/Support System * Spouse / significant other;Children   Type of Residence * [place] * No   Additional Services Not Applicable   Anticipated Discharge Dispostion/Needs Home with Family   Patient's Discharge Goal(s) Home   Barriers to Discharge * Clinical reason   Patient/Family/Other Engaged in Discharge Planning * Yes   Name, Relationship and Phone Number of Person Engaged in the Discharge Plan self   Patient Has Decision Making Capacity * Yes   Family/Caregiver's Assessed for * Readiness, willingness, and ability to provide or support self-management activities;Readiness to provide care to the patient   Respite Care * Not Applicable   Patient/Family/Other Are In Agreement With Discharge [place] Not Applicable   Social Worker Consult   Do you need to see a social worker? * No   Readmission Risk Assessment   Readmission Within 30 Days of Discharge * No   Recent Hospitalizations (Within Last 6 Months) * No   High Risk For Readmission * No     Per team, patient is 54 year old female with hx obesity, OSA, HLD, HTN, and GERD now status post lap paraesophageal hernia repair and RnY gastric bypass.    Patient reports to be ambulatory and independent with ADLs, no dme or home health.  Lives with significant other and 2 adult daughters in apartment. Reports to have good support from family who will assist as needed and provide transportation home.     PT discharge patient, no needs.     DCP: anticipating home with family when medically cleared.

## 2020-04-27 NOTE — Progress Notes (Cosign Needed)
PROGRESS NOTE  Bariatric/GI SURGERY      Date of service:  04/27/2020   Admission Date: 04/25/2020 Attending:  [facility] Day:   2 days - Admitted on: 04/25/2020    SUBJECTIVE / 24 HOUR EVENTS   NAEON  -VSS, afebrile  -Pain moderately well controlled on current regimen.   -Tolerating clear liquid diet   -Passing flatus, no BM.   -Voiding spontaneously; clear, yellow in color; non-bloody.   -Ambulating with shortness of breath and dizziness.  -Required oxygen for desaturation to 86 % overnight   -Denies vomiting, chest pain, fevers/chills    OBJECTIVE   VITAL SIGNS:  Temperature:  [97.3 F (36.3 C)-99.7 F (37.6 C)] 98.6 F (37 C)  Heart Rate:  [70-88] 88  Respirations:  [16-18] 16  Blood pressure (BP): (92-142)/(49-70) 125/58  SpO2:  [87 %-96 %] 91 %    10/21 0600 - 10/22 0559  In: 1380 [P.O.:1370; I.V.:10]  Out: -     PHYSICAL EXAM  GENERAL: NAD, non-toxic, atraumatic  NEURO: No gross abnormalities, grossly intact, able to move all 4 extremities, GCS 15  HEENT: Normocephalic, atraumatic, no scleral icterus or conjunctival pallor, mild swelling and erythema around eyes  NECK: Supple  CV: Regular rate  PULM: Normal work of breathing, equal chest rise   ABDOMEN: soft, non-distended, non-tender to palpation. No rebound or guarding, non-peritonitic. Incision sites clean, dry, intact.   SKIN: warm and dry, intact without jaundice. Mild erythema and swelling around eyes  EXTREMITIES: No lower extremity edema, warm to touch, well perfused       NEW DIAGNOSTICS  LABS:   CBC  Recent Labs     04/26/20  0512   WBCCOUNT 16.7*   RBC 4.59   HGB 11.5   HCT 36.0   MCV 78.4*   MCH 25.1*   MCHC 32.0   RDW 17.6*   MPVH 10.2   PLT 238     Chemistry  Recent Labs     04/26/20  0512   SODIUM 137   K 3.2*   CL 102   CO2 23   BUN 12   CREAT 0.6   GLU 128*   CA 8.2*   MG 1.5*   PHOS 3.8     ABG  ABG: pH  PCO2   PO2   FiO2         Liver Function Panel  No results for input(s): ALK, AST, ALT, TBILI, DBILI, ALB in the  last 72 hours.  Coags  No results for input(s): PROTIME, PTT, INR in the last 72 hours.  Lactate   No results for input(s): LACT in the last 72 hours.  Micro  Lab Results   Component Value Date    SDES VAGINA 02/07/2019    SDES SWAB VAGINA 02/07/2019    CULT 3+  NORMAL VAGINAL FLORA   02/07/2019    CULT 2+ YEAST (A) 02/07/2019    GS FEW  EPITHELIAL CELLS   02/07/2019    GS 2+  GRAM POSITIVE RODS   02/07/2019    GS FEW  BUDDING YEAST   02/07/2019         IMAGING: No results found.     MEDICATION SUMMARY  Scheduled:  . acetaminophen  975 mg Q8H   . acyclovir  400 mg TID   . albuterol  2 puff Q4H   . atorvastatin  40 mg QPM   . docusate sodium  250 mg BID   .  fluticasone  2 puff Q12H   . gabapentin  300 mg Q8H   . heparin  5,000 Units Q8H   . levothyroxine  125 mcg QAM AC   . metoprolol tartrate  25 mg BID   . ondansetron  4 mg Q6H   . traMADol  50 mg Q6H   . traZODone  50 mg HS   . venlafaxine  37.5 mg Daily     PRN:  . oxyCODONE  10 mg Q6H PRN   . oxyCODONE  5 mg Q4H PRN   . promethazine (PHENERGAN) IVPB  12.5 mg Q4H PRN     Infusions:      Other labs and imaging reviewed. Pertinents mentioned in assessment/plan.    ASSESSMENT AND PLAN   54 year old female with hx obesity, OSA, HLD, HTN, and GERD now status post lap paraesophageal hernia repair and RnY gastric bypass.    2 Days Post-Op        2 days - Admitted on: 04/25/2020    #Obesity:  - Status post laparoscopic RnY gastric bypass  -Abx: acyclovir   - Continue clear liquid diet. Pt with persistent nausea despite around the clock zofran  - will keep in house one more day for monitoring of nausea and slow advancement of diet  - dc home on lovenox due to high right for VTE    # Hypoventilation  # Dizziness on ambulation  - VSS  - Continue to monitor    #GERD:  - Status post laparoscopic paraesophageal hernia repair  - Denies symptoms of reflux and regurgitation    #Pain:   PRN: .  acetaminophen, 975 mg, Oral  .  oxyCODONE, 10 mg, Oral  .  oxyCODONE, 5 mg, Oral  .   traMADol, 50 mg, Oral     #Hypophosphatemia, hypokalemia, hypomagnesemia:  -Replete PRN    #Hypercholesterolemia  - Continue home dose of Atorvastatin     #Hypothyroidism  - Continue home dose of Levothyroxine     #Hypertension  - Continue home dose of Metoprolol tartrate    #Hospital Issues  -Encourage incentive spirometer, ambulation, O2 as necessary to maintain sat >90%  -Diet: Diet Order Therapeutic; Bariatric (6 small feedings), Clear Liquid; No Concentrated Sweets   -VTE ppx: SCDs, heparin-5,000 Units      Disposition: To home when meeting all appropriate discharge milestones  Code Status: Full Code    Theola Sequin, MS4    I have read and edited the medical students note and agree with the assessment and plan    Furkan G. Dellie Burns, MD  Resident Physician, PGY-1  Department of General Surgery          Required Daily Device Assessment:  This patient does not have a documented central line or urinary catheter.

## 2020-04-27 NOTE — Discharge Summary (Signed)
DISCHARGE SUMMARY    Patient Name:  RAISHA BRABENDER    Date of Admission:  04/25/2020  Date of Discharge:  04/27/2020     Principal Diagnosis:  Obesity, GERD    Hospital Problem List:  .     Active Hospital Problems    Diagnosis POA   . *Morbid obesity with BMI of 45.0-49.9, adult (CMS-HCC) [E66.01, Z68.42] Unknown      Resolved Hospital Problems   No resolved problems to display.        Additional Hospital Diagnoses ("rule out" or "suspected" diagnoses, etc.):  None     Principal Procedure During This Hospitalization:  Laparoscopic paraesophageal hernia repair, Roux-en-Y gastric bypass, EGD    Other Procedures Performed During This Hospitalization:  None    Procedure results are available in Chart Review in Epic.  For those providers external to [facility], the key procedure results are listed below:      Consultations Obtained During This Hospitalization:  None   Key consultant recommendations:      Reason for Admission to the Hospital / History of Present Illness:  Ms. Daidone is a 54 year old female with history of obesity, obstructive sleep apnea, hypertension, hyperlipidemia, and gastroesophageal reflux disease presenting for elective laparoscopic paraesophageal hernia repair, Roux-en-Y gastric bypass, and EGD with Dr. Retta Diones.     Hospital Course by Problem:        #Obesity:   - Status post laparoscopic Roux-en-Y gastric bypass  - continue clear liquid diet  - persistent nausea on POD1 requiring around the clock zofran    #GERD:  - Status post laparoscopic paraesophageal hernia repair    #Atelectasis  - Patient desaturated to 86% on room air and crackles on exam  - No LE erythema or pain, no tachycardia, abd patient denies chest pain thus suspicion for pulmonary embolism was low  - Chest X ray ruled out pneumonia and demonstrated post-operative atelectasis  - Encouraged incentive spirometry, sitting in the chair, and frequent ambulation  - Improved to 93% on RA with IS, ambulation, sitting  up    #Hypercholesterolemia  - Continue home dose of Atorvastatin     #Hypothyroidism  - Continue home dose of Levothyroxine     #Hypertension  - Continue home dose of Metoprolol tartrate      Hospital Events:  Ms. Bandel is a 54 year old female with history of obesity, obstructive sleep apnea, hypertension, hyperlipidemia, and gastroesophageal reflux disease presenting for elective laparoscopic paraesophageal hernia repair, Roux-en-Y gastric bypass, and EGD with Dr. Retta Diones. She tolerated the procedure well and there were no operative complications. On post operative day 0 the patient tolerated a clear liquid bariatric diet and was able to ambulate without difficulty. Her pain was moderately well controlled with a multimodal regimen. She complained of nausea on POD0, that persisted through POD1, thus Ms. Archambault remained inpatient for a second day. On POD2, she complained of mild shortness of breath and was desaturating to the high-mid 80s on room air, and thus was started on 2L nasal canula. Crackles were heard on exam with high suspicion for atelectasis given the patient had remained mostly in bed since the operation. Encouraged incentive spirometry, sitting in the chair throughout the day, and ambulating around the floor. A chest x-ray was ordered to rule out other lung pathology. Ms. Bruni breathing was improved and her chest x-ray returned unremarkable ?????? On post-operative day 2, Ms. Dias had pain moderately well controlled on oral pain regimen, tolerated a clear  liquid bariatric diet, voids independently, ambulates without difficulty, and has improved shortness of breath with a negative work-up and thus and was deemed safe and appropriate for discharge to home.     Tests Outstanding at Discharge Requiring Follow Up:  Pathology evaluation of Tip of Roux limb (jejunum)    Discharge Condition:  Stable    Key Physical Exam Findings at Discharge:  Mental Status Exam:  Patient is alert and oriented to  person, place, time and situation.  No significant physical examination findings at the time of discharge.    Discharge Diet:  Diet Order Therapeutic; Bariatric (6 small feedings), Clear Liquid; No Concentrated Sweets    Immunization History:    There is no immunization history on file for this patient.    Discharge Medications:     What To Do With Your Medications      START taking these medications      Add'l Info   enoxaparin 40 MG/0.4ML injection  Commonly known as: LOVENOX  Inject 0.4 mL (40 mg) under the skin daily.   Quantity: 14 each  Refills: 0     oxyCODONE 5 MG immediate release tablet  Commonly known as: ROXICODONE  Take 1 tablet (5 mg) by mouth every 4 hours as needed for Severe Pain (Pain Score 7-10).   Quantity: 12 tablet  Refills: 0        CONTINUE taking these medications      Add'l Info   Acetaminophen Extra Strength 500 MG tablet  TAKE 1 TABLET BY MOUTH EVERY 4 TO 6 HOURS AS NEEDED  Generic drug: acetaminophen   Refills: 0     acyclovir 400 MG tablet  Commonly known as: ZOVIRAX  Take 400 mg by mouth 3 times daily.   Refills: 0     albuterol 108 (90 Base) MCG/ACT inhaler  Inhale 2 puffs by mouth every 4 hours for 90 days   Refills: 0     amlodipine-benazepril 10-40 MG capsule  Commonly known as: LOTREL  Take 1 capsule by mouth daily   Refills: 0     atorvastatin 40 MG tablet  Commonly known as: LIPITOR  Take 1 tablet by mouth daily   Refills: 0     Blood Pressure Monitor/Arm Devi  U UTD   Refills: 0     Flovent HFA 110 MCG/ACT inhaler  Inhale 2 puffs by mouth twice daily  Generic drug: fluticasone   Refills: 0     folic acid 1 MG tablet  Commonly known as: FOLVITE  Take 1 mg by mouth daily.   Refills: 0     furosemide 40 MG tablet  Commonly known as: LASIX  Take 1 tablet by mouth daily as needed   Refills: 0     levothyroxine 125 MCG tablet  Commonly known as: SYNTHROID  Take 125 mcg by mouth every morning (before breakfast).   Refills: 0     loratadine 10 MG tablet  Commonly known as:  CLARITIN  Take 10 mg by mouth as needed (ALLERGIES).   Refills: 0     metoprolol tartrate 25 MG tablet  Commonly known as: LOPRESSOR  Take 25 mg by mouth 2 times daily.   Refills: 0     ondansetron 4 MG disintegrating tablet  Commonly known as: ZOFRAN ODT  Take 4 mg by mouth every 6 hours as needed for Nausea/Vomiting.   Refills: 0     predniSONE 20 MG tablet  Commonly known as: DELTASONE  Take 20 mg  by mouth 2 times daily.   Refills: 0     traZODone 50 MG tablet  Commonly known as: DESYREL  Take 50 mg by mouth at bedtime.   Refills: 0     venlafaxine 37.5 MG XR capsule  Commonly known as: EFFEXOR XR  Take 37.5 mg by mouth daily.   Refills: 0     vitamin D3 25 MCG (1000 UT) capsule  Take 1 capsule by mouth daily.   Refills: 0        STOP taking these medications    acetaminophen-codeine 300-30 MG tablet  Commonly known as: TYLENOL #3     famotidine 20 MG tablet  Commonly known as: PEPCID     HYDROcodone-acetaminophen 5-325 MG tablet  Commonly known as: NORCO     pantoprazole 40 MG tablet  Commonly known as: PROTONIX           Where to Get Your Medications      These medications were sent to [id number] - [place], [place] - [address] [place] [address]  [address] [place] [address], [place] [address]    Phone: [phone]    enoxaparin 40 MG/0.4ML injection   oxyCODONE 5 MG immediate release tablet           Allergies:  No Known Allergies      Discharge Disposition:  Home    Discharge Code Status:  Full code / full care  This code status is not changed from the time of admission.    Follow Up Appointments:  Scheduled appointments:  Future Appointments   Date Time Provider Will   05/08/2020  3:30 PM Hinojosa, Rosendo Gros, MD [facility]       For appointments requested for after discharge that have not yet been scheduled, refer to the [place] Discharge Referrals section of the After Visit Summary.    Discharging 18 Contact Information:  Discharging 29  Contact Information: Office phone at [phone]

## 2020-04-28 LAB — PATHOLOGY TISSUE EXAM - ~~LOC~~

## 2020-04-28 LAB — HISTORICAL HISTOLOGY DATA

## 2020-05-02 ENCOUNTER — Encounter: Payer: Self-pay | Admitting: Surgery

## 2020-05-08 ENCOUNTER — Ambulatory Visit: Payer: Medicaid Other | Attending: Surgery | Admitting: Surgery

## 2020-05-08 ENCOUNTER — Ambulatory Visit: Payer: Medicaid Other

## 2020-05-08 VITALS — BP 135/65 | HR 82 | Temp 97.8°F | Resp 18 | Ht 63.0 in | Wt 223.1 lb

## 2020-05-08 DIAGNOSIS — Z6841 Body Mass Index (BMI) 40.0 and over, adult: Secondary | ICD-10-CM | POA: Insufficient documentation

## 2020-05-08 MED ORDER — VENLAFAXINE HCL 150 MG OR CP24
150.0000 mg | ORAL_CAPSULE | Freq: Every day | ORAL | Status: AC
Start: 2020-04-16 — End: ?

## 2020-05-08 MED ORDER — PANTOPRAZOLE SODIUM 40 MG OR TBEC
1.0000 | DELAYED_RELEASE_TABLET | Freq: Every day | ORAL | Status: AC
Start: 2020-04-18 — End: ?

## 2020-05-08 MED ORDER — NARCAN 4 MG/0.1ML NA LIQD
NASAL | Status: AC
Start: 2018-08-25 — End: ?

## 2020-05-08 MED ORDER — FLOVENT HFA 110 MCG/ACT IN AERO
2.0000 | INHALATION_SPRAY | Freq: Two times a day (BID) | RESPIRATORY_TRACT | Status: AC
Start: 2020-03-25 — End: ?

## 2020-05-08 NOTE — Progress Notes (Signed)
Nutrition f/u: 2 weeks s/p gastric bypass    Pt has done well on the full liquid diet. She c/o dizziness and night seats. Pt will advance to pureed diet for the next 2 weeks. Dietitian reviewed diet advancement recommendations.      Pre-op 03/27/20: 246lbs  Today: 223lbs  Weight lost: 23lbs  9% wt loss    24-hour recall:  Tea, sugar free jello, water 20oz, 2 protein shakes/day    Total fluids: approx 50oz    Exercise: unable d/t dizziness    Vitamins: MVI, B12, [place], Vit D daily    Recommendations:  Advance to purees: 3 meals/day with protein shakes in between  Stat walking when dizziness subsides  Increase water to 40oz/day for a total fluid intake of 70oz  F/u in 4 weeks or prn

## 2020-05-08 NOTE — Progress Notes (Signed)
Bariatric Post Op/ Return      Brandi Hart is a Spanish speaking 54 year old woman s/p laparoscopic paraesophageal hernia repair, Roux en Y gastric bybass, and upper GI surgery on 04/25/2020. Today she states she has recently felt dizzy and experiences nightly hot flashes. She takes tylenol for her pain that radiates in her lower back. Her daily food intake includes 2 premier protein shakes as well as broths and jello. She only drinks 20 oz water daily as well as  16 oz decaffeinated tea.       Weight 03/27/2020 pre-surgery: 112 kg (246 lb 14.6 oz)  Weight: 101.2 kg (223 lb 1.7 oz)  Body mass index is 39.52 kg/m.  Weight loss: 23 lbs  % weight loss: 9.35%    Interval history:  Vomiting: No  Dysphagia: No   Nausea: No  Pain: No  Hypoglycemia: No  Dumping syndrome symptoms: No  Other:Confirms dizziness and nightly hot flashes         Supplements: No  Calcium vit D: No  Multivitamins with iron: No  Vitamin B12: No     Interval history/complications/concerns  Complications: No  Readmission/Reason: No  Operations: No     Co-Morbid Conditions  OSA: No  Diabetes: No  HTN: No  Degenerative Arthritis: No  Venous Stasis disease: No  GERD: No  Hyperlipidemia: No  Other: No     Vitals:    05/08/20 1441   BP: 135/65   Pulse: 82   Resp: 18   Temp: 97.8 F (36.6 C)   Weight: 101.2 kg (223 lb 1.7 oz)   Height: 5\' 3"  (1.6 m)     ABDOMEN:  nontender, nondistended, obese abdomen. Incisions well healing well.        Assessment/Plan:   S/p  gastric bybass, recovering well.   1.  Advance diet to purees as recommended by Dietician  2.  Start vitamin supplements   3.  Start pills  4.  Increase water intake to 64 oz. daily  5.  Advance exercise, avoid heavy lifting over 20 lbs for 2 more weeks  6.  Follow up with primary care provider     Follow up with Bariatric Clinic and RD in one month.       Scribe Attestation  The notes I am recording reflect only actions made by and judgments taken by this provider, Dr. Retta Diones, for whom I am  scribing today.  I have performed no independent clinical work.    Brandi Hart    ____________________________________________________________________    Provider Attestation for Scribed Note    As the attending provider, I agree with the scribed content.  Any changes or edits are noted in the text above.    Brandi Hart

## 2020-05-08 NOTE — Interdisciplinary (Signed)
Post Op    Pt is a 54 year old woman s/p laparoscopic paraesophageal hernia repair, Roux en Y gastric bybass, and upper GI surgery on 04/25/2020 who presented for her two week post op clinic appointment with Dr. Retta Diones. Pt reports that she has lost approximately 10 pounds since surgery.     Pt attended her appointment with her best friend. CSW assessed how pt was doing post surgery. Pt discussed that she is recovering however, shared feeling some pain and soreness. Pt expressed that she is following diet recommendations. Pt stated that she has not been able to engage in walking due to feeling dizziness. CSW encouraged that pt engages in walking when safe to do and feeling better. Pt was very receptive, appears committed in continuing to follow recommendations and expressed being motivated. CSW encouraged healthy diet for success.     CSW assessed pt's emotional wellbeing/ adjustment. Pt shared feeling good emotionally and did not identify concerns. CSW informed pt of  the bariatric support group however, pt shared not feeling the need to participate at this time as pt expressed having good support system consisting of her best friend and daughter who also had bariatric surgery two years ago.     Pt acknowledged CSW's availability should pt be in need of support resources. CSW praised pt, discussed that pt's body is going through a transition and provided encouragement. Pt was very pleasant.     CSW will remain available.     Milana Huntsman [place] CSW

## 2020-05-08 NOTE — Patient Instructions (Signed)
Please follow up in 4 weeks      Please call Suan Halter, RN [phone] if you have any questions or concerns. Please fax records or any applicable documents to [phone].     If you need to schedule or reschedule appointment:       [phone]        If you have not heard anything regarding your authorizations within 2 weeks, please call our  referral/ authorization coordinator/ surgery scheduler     Claiborne Billings [phone]           For billing questions, please call [phone]

## 2020-05-09 ENCOUNTER — Encounter: Payer: Self-pay | Admitting: Anesthesiology

## 2020-05-12 ENCOUNTER — Other Ambulatory Visit: Payer: Self-pay | Admitting: Surgery

## 2020-05-14 ENCOUNTER — Other Ambulatory Visit: Payer: Self-pay | Admitting: Surgery

## 2020-05-15 ENCOUNTER — Telehealth: Payer: Self-pay | Admitting: Surgery

## 2020-05-15 NOTE — Telephone Encounter (Signed)
PT called to get a refill on Rx: Enoxaparin 40mg .    Please advise    Send to [facility] on file   Callback: [phone]

## 2020-05-16 NOTE — Telephone Encounter (Signed)
Mychart message sent to patient and Dr. Retta Diones responded to RX refill via Epic.  Patient does not need to continue medication

## 2020-06-05 ENCOUNTER — Ambulatory Visit: Payer: Medicaid Other | Attending: Surgery | Admitting: Surgery

## 2020-06-05 ENCOUNTER — Encounter: Payer: Self-pay | Admitting: Surgery

## 2020-06-05 VITALS — BP 116/56 | HR 87 | Temp 98.2°F | Resp 16 | Ht 63.0 in | Wt 211.6 lb

## 2020-06-05 DIAGNOSIS — Z6841 Body Mass Index (BMI) 40.0 and over, adult: Secondary | ICD-10-CM | POA: Insufficient documentation

## 2020-06-05 MED ORDER — ONDANSETRON HCL 4 MG OR TABS
ORAL_TABLET | ORAL | Status: AC
Start: 2020-05-15 — End: ?

## 2020-06-05 NOTE — Progress Notes (Signed)
Bariatric Post Op/ Return      Brandi Hart is a Spanish speaking  54 year old woman s/p laparoscopic paraesophageal hernia repair, Roux en Y gastric bybass, and upper GI surgery on 04/25/2020         Currently she states she is experiencing nausea and vomiting after she eats protein shakes.  She is able to consume mashed potatoes and squash with ease. She states she is drinking water. She does not like protein shakes and does not want to drink them.     Day of surgery weight: 112 kg (246 lb 14.6 oz)  Weight: 96 kg (211 lb 10.3 oz)  Body mass index is 37.49 kg/m.  Weight loss: 35 lbs  % weight loss: 14%    Interval history:  Vomiting: Yes  Dysphagia: No   Nausea: Yes  Pain: No  Hypoglycemia: No  Dumping syndrome symptoms: No  Other:N/A        Supplements: No  Calcium vit D: No  Multivitamins with iron: No  Vitamin B12: No     Interval history/complications/concerns  Complications: No  Readmission/Reason: No  Operations: No     Co-Morbid Conditions  OSA: No  Diabetes: No  HTN: No  Degenerative Arthritis: No  Venous Stasis disease: No  GERD: No  Hyperlipidemia: No  Other: N/A     Vitals:    06/05/20 1511   BP: 116/56   BP Location: Left arm   BP Patient Position: Sitting   BP cuff size: Regular   Pulse: 87   Resp: 16   Temp: 98.2 F (36.8 C)   TempSrc: Temporal   Weight: 96 kg (211 lb 10.3 oz)   Height: 5\' 3"  (1.6 m)     ABDOMEN:  nontender, nondistended, obese abdomen. Incisions well healing well.        Assessment/Plan:      s/p laparoscopic paraesophageal hernia repair, Roux en Y gastric bybass, and upper GI surgery on 04/25/2020. It was stressed she needs to increase her water intake and can try drinking Gatorade zero. The importance of improving her diet schedule was stressed in order to help her symptoms and prevent dumping. She should refer to our dietician for this. It was also recommended she try protein water since she does not like to drink protein shakes.      1.  Advance diet as recommended by  Dietician   2.  Continue vitamin supplements   3.  Conitinue pills  4.  Increase water intake  5.  Advance exercise  6.  Follow up with primary care provider  7. Increase protein intake     Follow up with dietician tomorrow, follow up with phone call in one week, and return to clinic in 3 months, or sooner PRN.      Scribe Attestation  The notes I am recording reflect only actions made by and judgments taken by this provider, Dr. Retta Diones, for whom I am scribing today.  I have performed no independent clinical work.    [name]    ____________________________________________________________________    Provider Attestation for Scribed Note    As the attending provider, I agree with the scribed content.  Any changes or edits are noted in the text above.    Brydan Downard Wilfran Whitlee Sluder

## 2020-06-05 NOTE — Patient Instructions (Signed)
Please follow up with our clinic in 3 months.    Please call Suan Halter, RN [phone] if you have any questions or concerns. Please fax records or any applicable documents to [phone].    If you need to schedule or reschedule appointment:      [phone]    GI Surgery (Dr. Duffy Bruce, Dr. Retta Diones):  Weekend MD on call [phone]  Hepatobiliary Surgery (Dr. Eliberto Ivory):  Weekend MD on call [phone]    Surgery scheduler (Dr. Eliberto Ivory) Hope Pigeon [phone]  Authorization coordinator: (Dr. Eliberto Ivory) Maryelizabeth Kaufmann [phone]     Surgery scheduler (Dr. Duffy Bruce, Dr. Retta Diones) Sotra Pho [phone]  Authorization Coordinator (Dr. Duffy Bruce, Dr. Retta Diones) Norina Buzzard [phone]         For billing questions, please call [phone]

## 2020-06-05 NOTE — Interdisciplinary (Signed)
Follow Up     CSW met with pt after receiving verbal consult order from [place] for CSW to meet with pt as check in. Pt presented to clinic appointment today with Dr. Alene Mires for follow up. Pt had Roux en Y gastric bybass and upper GI surgery on 04/25/2020 . Pt was accompanied by her husband.     CSW assessed how pt was doing. Pt disclosed that she had been feeling dizzy and said that she addressed her physical symptoms with Dr. Retta Diones today during her appointment. Pt expressed that she is following a healthy diet. CSW discussed importance of following healthy diet and recommendations. Pt appeared receptive.     Pt disclosed to CSW that her brother recently passed away on 2022/09/08 and shared that her mother is currently hospitalized. CSW provided support, apologized for loss, acknowledged what pt is going through and assessed need for therapy. Pt denied needing therapy as pt shared with CSW that she has family support and feels calm.     CSW offered to provide pt with counseling referrals as resource which pt accepted. CSW suggested that pt takes care of self emotionally. Pt appeared receptive. CSW reminded pt of the bariatric support group.     CSW updated team.     CSW will remain available.     Milana Huntsman [place] CSW

## 2020-06-14 ENCOUNTER — Ambulatory Visit: Payer: Medicaid Other

## 2020-06-14 NOTE — Progress Notes (Signed)
VISIT TYPE: RD Post Op FU-6 weeks     Due to COVID-19 pandemic and a federally declared state of public health emergency, this telemedicine visit was conducted Audio Only. Total time spent was 21+ minutes.      This is a 54 year old female contacted via telephone for follow up. Pt s/p HH repair and Roux-en-Y gastric bypass on 04/25/20. RD consulted by Dr. Retta Diones  RD provided nutrition education and discussed nutrition plan for weight loss. Spanish  Interpreter [phone].     NUTRITION ASSESSMENT  Anthropometrics:  Height: 63 inches  Weight: Per Pt today's weight 208 lbs,   Date Weight Recorded 06/05/2020 05/08/2020 04/25/2020 03/27/2020 02/21/2020 10/18/2019   Metric 96 kg 101.2 kg 106 kg 112 kg 111.2 kg 116.574 kg   Pounds/Ounces 211 lb 10.3 oz 223 lb 1.7 oz 233 lb 11 oz 246 lb 14.6 oz 245 lb 2.4 oz 257 lb       Weight loss: 25 lbs   BMI: 36.8    Medical/Surgical History:  Past Medical History:   Diagnosis Date   . Anemia    . Congenital kidney disease    . Coronary artery disease    . Fibroids    . Gastritis    . GI bleed    . Hypertension    . Hypothyroidism      Past Surgical History:   Procedure Laterality Date   . CESAREAN SECTION, LOW TRANSVERSE     . ENDOMETRIAL ABLATION      x2   . TUBAL LIGATION      at time of C-section       Pertinent Nutrition Related Labs: Reviewed    Pertinent Nutrition Related Medication: Reviewed  Nutrition Supplements:   Calcium: yes  Vitamin D: Yes  Vitamin B12 yes  Iron: yes  MVI: yes    Nutrition/Diet History:   Pt reports weight loss of 25 lbs since surgery. Currently, Pt is following a Soft diet.   Pt goal weight is 150 lbs.  Protein intake: 40 gm/day   Fluid intake: tea 1 cup, Gatorade zero 8 oz, 48 oz water    24 Hour Food Recall:  Breakfast: cream of wheat  Lunch: scrambled egg  Dinner: broth  Snacks: mango, jello, yogurt   Oral Nutritional Supplements: none    Nutrition Focused Physical Findings: NA, phone consult.  Bowel Function: Regular   Current Exercise: walking 10  minutes, d/t dizziness   Family Support: Lives with family     [place] and nutrition related knowledge deficit related to need for overview of importance of protein intake and hydration as evidenced by need for RD Consult.     NUTRITION INTERVENTION  Nutrition Prescription  Estimated Kcal Needs: 1200-1400 Kcal/day  Estimated Protein Needs: (1.5 gm/Kg IBW) 78 gm Pro/day  Estimated Fluid Needs: (1 mL/kcal) 1200-1400 mL/day    Nutrition Education:  Person Taught: Patient  Readiness to learn: Expresses interest and gaining insight on information from today's session.   Education Needed/Provided: High protein meal and snack options, Protein drinks, Unflavored protein powder  Outcome/Evaluation: Verbalizes understanding and was able to demonstrate comprehension on verification.   Nutrition Education Method: RD provided education via verbal instructions and written instructions.     Goals: Lose 1-2 lbs/week; Maintain lean body mass; PO intake to meet estimated protein needs.     Nutrition Counseling:  Recommended Plan:   1. 3-4 small meals, High protien  2. Increase protein intake to 70 gm daily  3. Increase physical activity to 60 minutes daily     Pt demonstrates good understanding and shows good motivation to comply.     NUTRITION MONITORING  Follow-up plan for ongoing self-management support:  Food/Dietary Intake, Fluid/Beverage Intake, Weight  Follow up at next Bariatric visit/PRN    Notes routed to referring provider via EMR

## 2020-08-21 ENCOUNTER — Ambulatory Visit: Payer: Medicaid Other

## 2020-08-21 ENCOUNTER — Ambulatory Visit: Payer: Medicaid Other | Attending: Surgery | Admitting: Surgery

## 2020-08-21 DIAGNOSIS — Z6841 Body Mass Index (BMI) 40.0 and over, adult: Secondary | ICD-10-CM

## 2020-08-21 DIAGNOSIS — R112 Nausea with vomiting, unspecified: Secondary | ICD-10-CM | POA: Insufficient documentation

## 2020-08-21 NOTE — Interdisciplinary (Signed)
See note

## 2020-08-21 NOTE — Progress Notes (Signed)
Nutrition f/u:  Brandi Hart is a 55 year old woman s/p laparoscopic paraesophageal hernia repair, Roux en Y gastric bybass on 04/25/2020    S: having trouble with taking POs. Nausea and emesis. Not tolerating meats. Only able to take in fruit and vegetables.   Able to eat fruits with out issues.  Able to eat ceviche. soup without the meat.  Tolerating water and ice.  No juices. Not able to tolerate much protein.     Day of surgery weight: 246lbs     Date Weight Recorded 06/05/2020 05/08/2020 04/25/2020 03/27/2020 02/21/2020 10/18/2019   Metric 96 kg 101.2 kg 106 kg 112 kg 111.2 kg 116.574 kg   Pounds/Ounces 211 lb 10.3 oz 223 lb 1.7 oz 233 lb 11 oz 246 lb 14.6 oz 245 lb 2.4 oz 257 lb        Weight loss since surgery: 35lbs    Interval history:  Vomiting: Yes worse since last visit  Dysphagia: No   Nausea: Yes  Pain: Yes umbilical   Hypoglycemia: No  Dumping syndrome symptoms: uncertain;   sensitive to smells  Cant eat: chicken, meat, eggs, dairy, nuts, peanut butter, lentils, beans   Ok: fish in ceviche, fruit and vegetables     24-hour recall:  B: melon with mint tea  L: ceviche  D: vegetable soup  S: pineapple    Protein shakes/protein water: no   Meals per day: 3 plus a snack  Volume: 1/2 cup    Supplements: Yes  Calcium vit D: Yes  Multivitamins with iron: Yes  Vitamin B12: Yes    Inadequate protein intake as indicated by 24-hour recall and multiple food intolerances. Recommend try new proteins ie canned tuna is isolation to test for tolerance.  Focus on proteins that are tolerated, ie ceviche.  Limit portions of fruit to 1/4-1/2 cup, best tolerated with a protein.  F/u at next f/u

## 2020-08-21 NOTE — Progress Notes (Signed)
Bariatric Post Op/ Return ZOOM    Due to COVID-19 pandemic and a federally declared state of public health emergency, this telemedicine visit was conducted Audio Only. Total time spent was 11-20 minutes.        Brandi Hart is a 55 year old woman s/p laparoscopic paraesophageal hernia repair, Roux en Y gastric bybass, and upper GI surgery on 04/25/2020      S: having trouble with taking POs. Nausea and emesis. Not tolerating meats. Only able to take in fruit and vega table.  Noses some gurgling noises. Able to fruits with out issues.  Able to eat ceviche with fish. Chicken soup without the meat.  Tolerating water and ice.  No juices. Not able to tolerated much protein. Has not follow up with pcp.        Day of surgery weight:    182lbs     Weight loss: 64      Interval history:  Vomiting: Yes  Dysphagia: No   Nausea: Yes  Pain: Yes umbilical   Hypoglycemia: No  Dumping syndrome symptoms: Yes might be having dumping has some symptom flushing, lightheaded, diarrhea      Food tolerance:meat, protein shakes  Meals per day: 3  Volume: 1/2 cup     Supplements: Yes  Calcium vit D: Yes  Multivitamins with iron: Yes  Vitamin B12: Yes     Interval history/complications/concerns  Complications: No  Readmission/Reason: No  Operations: No     Co-Morbid Conditions  OSA: No  Diabetes: No  HTN: Yes  Degenerative Arthritis: No  Venous Stasis disease: No  GERD: No resolved after repair  Hyperlipidemia: No       Assessment/Plan:   S/p  gastric bybass, and PEH repair  1.  labs  2. ugi  3. F/u with primary care for comorbidities   4. F/u with me after above or if symptoms worsen.     Follow up with Bariatric Clinic and RD after above.

## 2020-08-21 NOTE — Patient Instructions (Addendum)
1. Please do your UGI. Please call [phone] to schedule your UGI. Please wait 1 week before calling to schedule this as we still need to get authorization from your insurance.   If you have your scan done at an outside facility, please obtain a disk of the scan when you are there and bring the disk with you to your follow-up appointment.  Have the facility fax the results to [phone], or pick up the results at a later date and bring with you to your follow-up appointment with [place]    2. Please have fasting blood test done     3. Please follow up with our clinic in 4 weeks.      1. Haga su UGI. Espere 1 semana antes de llamar para programar esto, ya que an necesitamos obtener la autorizacin de su seguro.  Si se realiza la exploracin en un centro externo, Geneticist, molecular un disco de la exploracin cuando est all y trigalo a su cita de seguimiento. Haga que el centro enve los [place] por fax al [phone], o recoja los [place] en una fecha posterior y trigalos a su cita de seguimiento con nosotros.    2. Realcese un anlisis de [employer].    3. Haga un seguimiento con Cleotis Nipper clnica en 4 semanas.      Please call Suan Halter, RN [phone] if you have any questions or concerns. Please fax records or any applicable documents to [phone].    If you need to schedule or reschedule appointment:      [phone]    GI Surgery (Dr. Duffy Bruce, Dr. Retta Diones):  Weekend MD on call [phone]  Hepatobiliary Surgery (Dr. Eliberto Ivory):  Weekend MD on call [phone]    Surgery scheduler (Dr. Eliberto Ivory) Hope Pigeon [phone]  Authorization coordinator: (Dr. Eliberto Ivory) Maryelizabeth Kaufmann [phone]     Surgery scheduler (Dr. Duffy Bruce, Dr. Retta Diones) Sotra Pho [phone]  Authorization Coordinator (Dr. Duffy Bruce, Dr. Retta Diones) Delbra Zellars [phone]         For billing questions, please call [phone]

## 2020-09-10 ENCOUNTER — Other Ambulatory Visit: Payer: Self-pay | Admitting: Surgery

## 2020-09-10 ENCOUNTER — Ambulatory Visit
Admission: RE | Admit: 2020-09-10 | Discharge: 2020-09-10 | Disposition: A | Discharge status: Home / Self Care | Payer: Medicaid Other | Attending: Surgery | Admitting: Surgery

## 2020-09-10 DIAGNOSIS — R112 Nausea with vomiting, unspecified: Secondary | ICD-10-CM

## 2020-09-10 DIAGNOSIS — Z6841 Body Mass Index (BMI) 40.0 and over, adult: Secondary | ICD-10-CM | POA: Insufficient documentation

## 2020-09-10 DIAGNOSIS — Z9884 Bariatric surgery status: Secondary | ICD-10-CM | POA: Insufficient documentation

## 2020-09-10 MED ORDER — BARIUM SULFATE 60 % OR SUSP
180.0000 mL | Freq: Once | ORAL | Status: AC
Start: 2020-09-10 — End: 2020-09-10
  Administered 2020-09-10 (×2): 180 mL via ORAL

## 2020-09-10 MED ORDER — BARIUM SULFATE 98 % OR SUSR
80.0000 mL | Freq: Once | ORAL | Status: AC
Start: 2020-09-10 — End: 2020-09-10
  Administered 2020-09-10: 80 mL via ORAL

## 2020-09-25 ENCOUNTER — Ambulatory Visit: Payer: Medicaid Other | Attending: Surgery | Admitting: Surgery

## 2020-09-25 DIAGNOSIS — Z9884 Bariatric surgery status: Secondary | ICD-10-CM | POA: Insufficient documentation

## 2020-09-25 DIAGNOSIS — R112 Nausea with vomiting, unspecified: Secondary | ICD-10-CM

## 2020-09-25 NOTE — Patient Instructions (Addendum)
Please follow up with our clinic in 3 months.    PLEASE HAVE LABS DONE: FASTING      Please call Suan Halter, RN [phone] if you have any questions or concerns. Please fax records or any applicable documents to [phone].    If you need to schedule or reschedule appointment:      [phone]    GI Surgery (Dr. Duffy Bruce, Dr. Retta Diones):  Weekend MD on call [phone]  Hepatobiliary Surgery (Dr. Eliberto Ivory):  Weekend MD on call [phone]    Surgery scheduler (Dr. Eliberto Ivory) Hope Pigeon [phone]  Authorization coordinator: (Dr. Eliberto Ivory) Maryelizabeth Kaufmann [phone]     Surgery scheduler (Dr. Duffy Bruce, Dr. Retta Diones) Sotra Pho [phone]  Authorization Coordinator (Dr. Duffy Bruce, Dr. Retta Diones) Mykal Kirchman [phone]         For billing questions, please call [phone]

## 2020-09-25 NOTE — Progress Notes (Signed)
[facility]OM Bariatric Post Op/ Return   Due to COVID-19 pandemic and a federally declared state of public health emergency, this telemedicine visit was conducted Audio Only. Total time spent was 5-10 minutes.      Brandi Hart is a 55 year old woman  s/p laparoscopic paraesophageal hernia repair, Roux en Y gastric bybass, and upper GI surgery on 04/25/2020 . At her last visit, she was having trouble with taking POs. Experiencing nausea and emesis. Not tolerating meats and she could only take in fruits and vegetables. She also noticed gurgling noises.  Able to eat ceviche with fish, chicken soup without the meat, tolerating water and ice, but not juices. Not able to tolerated much protein. It was recommended she follow up with her PCP. 09/10/2020 UGI demonstrated mild gastroesophageal reflux to distal one third esophagus.    Day of surgery weight: 174lb     Since last visit  UGI was normal.  Has a little pain at largest port site. On PPI once per day.    No nausea improved.    Still has trouble eating meats has nausea, emesis, and diarrhea.  Can have boiled fish.         Imaging:  UGI   09/10/2020  IMPRESSION:  1.  Post surgical changes of paraesophageal hernia repair with Roux-en-Y gastric bypass without delay in contrast passage.  2.  No hiatal hernia. Mild gastroesophageal reflux to distal one third esophagus.    Interval history:  Vomiting: Yes  Dysphagia: No   Nausea: Yes  Pain: Yes at port site  Hypoglycemia: No  Dumping syndrome symptoms: Yes maybe with meats.     Food tolerance:most things has trouble with meats, eggs.    Meals per day: 2-3, has some fruit.    Volume: 1/2 cup     Supplements: Yes  Calcium vit D: Yes  Multivitamins with iron: Yes  Vitamin B12: Yes     Interval history/complications/concerns  Complications: No  Readmission/Reason: No  Operations: No          There were no vitals filed for this visit.  Deferred due to Telemedicine      Assessment/Plan:   S/p  laparoscopic paraesophageal hernia  repair, Roux en Y gastric bybass, and upper GI surgery on 04/25/2020 .    1.continue with PPI  2. Labs  3. F/u in 3 months.

## 2020-10-17 ENCOUNTER — Telehealth: Payer: Self-pay | Admitting: Surgery

## 2020-10-17 NOTE — Telephone Encounter (Signed)
Please send pts labs orders to [facility] off [address] as she will be going in Friday to complete. Please return pts call to confirm request has been complete .     Note : spanish speaking pt     Thank you,

## 2020-10-17 NOTE — Telephone Encounter (Signed)
Mychart message sent to patient that lab orders are in for [facility]

## 2020-11-07 LAB — CBC WITH DIFF, BLOOD
Baso (Absolute): 0 10*3/uL (ref 0.0–0.2)
Basos: 0 %
Eos (Absolute): 0.1 10*3/uL (ref 0.0–0.4)
Eos: 2 %
Hematocrit: 40.8 % (ref 34.0–46.6)
Hemoglobin: 13.1 g/dL (ref 11.1–15.9)
Immature Grans (Abs): 0 10*3/uL (ref 0.0–0.1)
Immature Granulocytes: 0 %
Lymphs (Absolute): 2.8 10*3/uL (ref 0.7–3.1)
Lymphs: 31 %
MCH: 27.6 pg (ref 26.6–33.0)
MCHC: 32.1 g/dL (ref 31.5–35.7)
MCV: 86 fL (ref 79–97)
Monocytes(Absolute): 0.6 10*3/uL (ref 0.1–0.9)
Monocytes: 6 %
Neutrophils (Absolute): 5.4 10*3/uL (ref 1.4–7.0)
Neutrophils: 61 %
Platelets: 235 10*3/uL (ref 150–450)
RBC: 4.74 x10E6/uL (ref 3.77–5.28)
RDW: 13.3 % (ref 11.7–15.4)
WBC: 8.9 10*3/uL (ref 3.4–10.8)

## 2020-11-07 LAB — LIPID(CHOL FRACT) PANEL, BLOOD
Cholesterol: 168 mg/dL (ref 100–199)
HDL Cholesterol: 53 mg/dL (ref >39)
LDL Chol CAL (NIH) - LABCORP: 96 mg/dL (ref 0–99)
Non-HDL Cholesterol: 115 mg/dL (ref 0–129)
Triglycerides: 106 mg/dL (ref 0–149)
VLDL Cholesterol CAL: 19 mg/dL (ref 5–40)

## 2020-11-07 LAB — COMPREHENSIVE METABOLIC PANEL, BLOOD
A/G Ratio: 1.3 (ref 1.2–2.2)
ALT (SGPT): 20 IU/L (ref 0–32)
AST: 30 IU/L (ref 0–40)
Albumin: 3.9 g/dL (ref 3.8–4.9)
Alkaline Phos: 85 IU/L (ref 44–121)
BUN/Creatinine Ratio: 19 (ref 9–23)
BUN: 11 mg/dL (ref 6–24)
Bilirubin, Total: 0.4 mg/dL (ref 0.0–1.2)
Calcium: 9.4 mg/dL (ref 8.7–10.2)
Carbon Dioxide: 24 mmol/L (ref 20–29)
Chloride: 101 mmol/L (ref 96–106)
Creatinine: 0.58 mg/dL (ref 0.57–1.00)
EGFR: 107 mL/min/{1.73_m2} (ref >59)
Globulin, Total: 3 g/dL (ref 1.5–4.5)
Glucose: 95 mg/dL (ref 65–99)
Potassium: 3.9 mmol/L (ref 3.5–5.2)
Protein, Total, Serum: 6.9 g/dL (ref 6.0–8.5)
Sodium: 141 mmol/L (ref 134–144)

## 2020-11-07 LAB — IRON, BLOOD: Iron, Serum: 54 ug/dL (ref 27–159)

## 2020-11-07 LAB — COPPER, SERUM: Copper: 115 ug/dL (ref 80–158)

## 2020-11-07 LAB — FERRITIN, BLOOD: Ferritin: 28 ng/mL (ref 15–150)

## 2020-11-07 LAB — LIPOPROTEIN(A), BLOOD: Lipoprotein (a): 63.5 nmol/L (ref <75.0)

## 2020-11-07 LAB — TSH, BLOOD: TSH: 0.04 u[IU]/mL — ABNORMAL LOW (ref 0.450–4.500)

## 2020-11-07 LAB — GLYCOSYLATED HGB(A1C), BLOOD: Hemoglobin A1c: 5.2 % (ref 4.8–5.6)

## 2020-11-07 LAB — VITAMIN A, BLOOD: Vitamin A: 23.6 ug/dL (ref 20.1–62.0)

## 2020-11-07 LAB — VITAMIN D, 25-OH TOTAL: Vitamin D, 25-Hydroxy: 46.8 ng/mL (ref 30.0–100.0)

## 2020-11-07 LAB — VITAMIN B12, BLOOD: Vitamin B12: 332 pg/mL (ref 232–1245)

## 2020-12-18 ENCOUNTER — Encounter: Payer: Self-pay | Admitting: Surgery

## 2020-12-18 ENCOUNTER — Ambulatory Visit: Payer: Medicaid Other | Attending: Surgery | Admitting: Surgery

## 2020-12-18 VITALS — BP 119/74 | HR 65 | Temp 96.6°F | Resp 16 | Ht 63.0 in | Wt 156.3 lb

## 2020-12-18 DIAGNOSIS — K2100 Gastro-esophageal reflux disease with esophagitis, without bleeding: Secondary | ICD-10-CM | POA: Insufficient documentation

## 2020-12-18 DIAGNOSIS — K449 Diaphragmatic hernia without obstruction or gangrene: Secondary | ICD-10-CM | POA: Insufficient documentation

## 2020-12-18 DIAGNOSIS — Z6827 Body mass index (BMI) 27.0-27.9, adult: Secondary | ICD-10-CM

## 2020-12-18 DIAGNOSIS — Z9884 Bariatric surgery status: Secondary | ICD-10-CM | POA: Insufficient documentation

## 2020-12-18 MED ORDER — ACETAMINOPHEN 500 MG OR TABS
1.00 | ORAL_TABLET | Freq: Four times a day (QID) | ORAL | Status: AC | PRN
Start: 2020-11-12 — End: ?

## 2020-12-18 MED ORDER — ATORVASTATIN CALCIUM 80 MG OR TABS
80.00 mg | ORAL_TABLET | Freq: Every day | ORAL | Status: AC
Start: 2020-11-12 — End: ?

## 2020-12-18 MED ORDER — ONDANSETRON HCL 4 MG OR TABS
4.00 mg | ORAL_TABLET | ORAL | Status: AC
Start: 2020-11-12 — End: ?

## 2020-12-18 MED ORDER — LEVOTHYROXINE SODIUM 100 MCG OR TABS
ORAL_TABLET | ORAL | Status: AC
Start: 2020-11-13 — End: ?

## 2020-12-18 NOTE — Patient Instructions (Addendum)
Please follow up with our clinic in 3 months.    Please call [phone] to schedule your CT scan.. Please allow 1 week to process your insurance.   If you have your scan done at an outside facility, please obtain a disk of the scan when you are there and bring the disk with you to your follow-up appointment.  Have the facility fax the results to [phone], or pick up the results at a later date and bring with you to your follow-up appointment with [place].      Please call Suan Halter, RN [phone] if you have any questions or concerns. Please fax records or any applicable documents to [phone].    If you need to schedule or reschedule appointment:      [phone]    GI Surgery (Dr. Duffy Bruce, Dr. Retta Diones):  Weekend MD on call [phone]  Hepatobiliary Surgery (Dr. Eliberto Ivory):  Weekend MD on call [phone]    Surgery scheduler (Dr. Eliberto Ivory) Hope Pigeon [phone]  Authorization coordinator: (Dr. Eliberto Ivory) Mamie Nick [phone]     Surgery scheduler (Dr. Duffy Bruce, Dr. Retta Diones) Sotra Pho [phone]  Authorization Coordinator (Dr. Duffy Bruce, Dr. Retta Diones) Roisin Mones [phone]         For billing questions, please call [phone]

## 2020-12-18 NOTE — Progress Notes (Signed)
Bariatric Post Op/ Return      Brandi Hart is a 55 year old woman s/p laparoscopic paraesophageal hernia repair, Roux en Y gastric bypass on 04/25/2020.      Day of surgery weight: 211 lb 10.3 oz  Weight: 70.9 kg (156 lb 4.9 oz)  Body mass index is 27.69 kg/m.  Weight loss: 55 pounds    Interval history:  She has been having sharp, right abdominal pain that has been progressively worsening for the past 3 months especially when lifting heavy objects, improved with lying down. Still having regular bowel movements. Still taking PPI. Had an episode of chest pain 1 month ago when she was driving through [place], during which time her PCP prescribed her nitroglycerin.   Vomiting: No  Dysphagia: No   Nausea: Yes, occasionally, related to smell food   Pain: Yes  Hypoglycemia: Yes  Dumping syndrome symptoms: No   Other: low oxygen saturation when feeling sweating and fast heart rate x2 months 3-4 times a day, chest pain once every week uses sublingual nitroglycerin, insomnia (2-3 hours)      Food tolerance: can tolerate chicken, vegetables, fish, beef  Meals per day: 2-3 meals   Volume: 1/2 cup     Supplements: Yes  Calcium vit D: Yes  Multivitamins with iron: Yes  Vitamin B12: Yes     Interval history/complications/concerns  Complications: Yes  Readmission/Reason: No  Operations: Yes     Co-Morbid Conditions  OSA: No  Diabetes: No  HTN: No  Degenerative Arthritis: No  Venous Stasis disease: No  GERD: No  Hyperlipidemia: No       Vitals:    12/18/20 1259   BP: 119/74   BP Location: Left arm   BP Patient Position: Sitting   BP cuff size: Regular   Pulse: 65   Resp: 16   Temp: 96.6 F (35.9 C)   TempSrc: Temporal   Weight: 70.9 kg (156 lb 4.9 oz)   Height: 5\' 3"  (1.6 m)        WBC 16.7* (10/21) HGB 13.1 (04/22) PLT 235 (04/22)    HCT 40.8 (04/22)      Na 137 (10/21) CL 101 (04/22) BUN 11 (04/22) GLU   95 (04/22)   K 3.9 (04/22) CO2 23 (10/21) Cr 0.58 (04/22)      Cholesterol 168 (04/22) HDL   LDL   Triglycerides  106 (04/22)     A1c 5.2 (04/22)   Vitamin D,25-hydroxy: 46.8  Vitamin B12: 332  Vitamin A: 23.6  TSH: 0.040  Iron, serum: 54  Ferritin: 28  Copper: 115    ABDOMEN: nondistended, obese abdomen. Incisions well healing well. Tender to palpation in superior half of abdomen. No rebound tenderness.      Assessment/Plan:   S/p  laparoscopic paraesophageal hernia repair, Roux en Y gastric bybass, recovering well but concern for recurrent hernia with abdominal pain.   1. Obtain CT A/P non-con  2. Abdominal binder for comfort   3. F/u with cardiologist regarding angina        Follow up with [facility] and RD in 3 months.     Attending Attestation:   I evaluated and examined the patient and I agree with medical student, Nguyen's note as edited by me to represent my evaluation, exam, and decision making.

## 2020-12-27 ENCOUNTER — Ambulatory Visit: Payer: Medicaid Other

## 2021-01-01 ENCOUNTER — Ambulatory Visit: Payer: Medicaid Other

## 2021-01-01 NOTE — Progress Notes (Signed)
VISIT TYPE: RD Post Op FU     Due to COVID-19 pandemic and a federally declared state of public health emergency, this telemedicine visit was conducted Audio Only. Total time spent was 5-10 minutes.      This is a 55 year old female contacted via telephone for Post Op FU. Pt s/p laparoscopic paraesophageal hernia repair, Roux en Y gastric bypass on 04/25/2020. RD consulted by Dr. Retta Diones.      Unable to reach Pt via telephone. RD left VM and sent message via MyChart system for Pt to reach [facility] and acquire appointment with RD as needed for any nutrition related questions/concerns.

## 2021-02-16 ENCOUNTER — Ambulatory Visit: Payer: Medicaid Other

## 2021-03-19 ENCOUNTER — Ambulatory Visit: Payer: Medicaid Other | Admitting: Surgery

## 2021-03-25 ENCOUNTER — Telehealth: Payer: Self-pay | Admitting: Surgery

## 2021-03-25 NOTE — Telephone Encounter (Signed)
Hi Renata,     Pt would like to speak to you regarding pain in stomach, she would also like to discuss tests that Dr. Retta Diones wanted her to do. Pt speaks spanish.    Thank you

## 2021-03-26 NOTE — Telephone Encounter (Signed)
X 1 month     Hard stomach,   All of stomach   bloated, gas  Pain 9,     Avoid gassy foods eat slow gas x miralax daily no drinking from straw sodas    1week, to schedule an ppointment

## 2021-03-26 NOTE — Telephone Encounter (Signed)
Patient will get her CT scan done that was ordered and follow up with [place] after.      ED precautions given

## 2021-05-07 ENCOUNTER — Ambulatory Visit: Payer: Medicaid Other | Attending: Surgery | Admitting: Surgery

## 2021-05-07 DIAGNOSIS — Z9884 Bariatric surgery status: Secondary | ICD-10-CM

## 2021-05-07 NOTE — Patient Instructions (Signed)
 DIETICIAN WILL CALL TO DISCUSS DIET, INTAKE OF PROTEIN    Please schedule your CT scan att [facility]    Please follow up with our clinic in after CT scan has been completed for hernia repair.    Please call Courtney Paris, RN [phone] if you have any quest

## 2021-05-07 NOTE — Progress Notes (Signed)
 Due to COVID-19 pandemic and a federally declared state of public health emergency, this telemedicine visit was conducted Audio Only. The patient was in [place] for the duration of the visit.  The patient consented to a virtual visit, understanding the

## 2021-05-14 ENCOUNTER — Telehealth: Payer: Self-pay

## 2023-05-01 IMAGING — MR COLUNAS cn^COLUNA LOMBAR
5 of 7 series · 35 of 48 positions shown · non-contrast
Comparison: none

[Series 2: T2 · sagittal · 3.5mm · 1.12mm/px · 7 of 14 slices shown (1 of 4)]
[im 1/14]
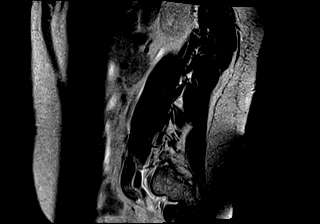
[im 3/14]
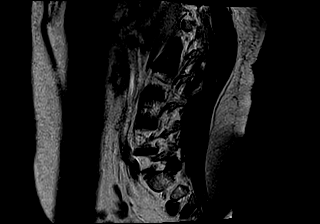
[im 5/14]
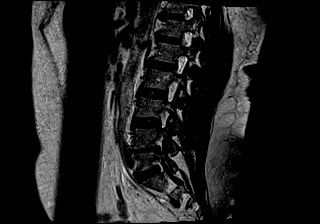
[im 7/14]
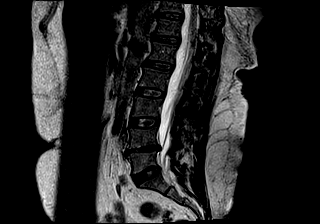
[im 9/14]
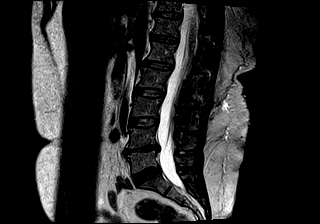
[im 11/14]
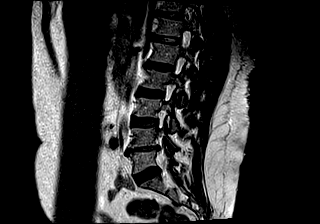
[im 14/14]
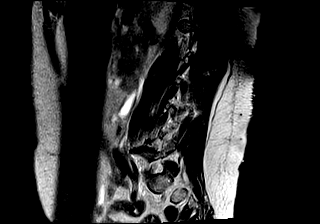

[Series 3: T1 · sagittal · 3.5mm · 1.12mm/px · 7 of 14 slices shown]
[im 1/14]
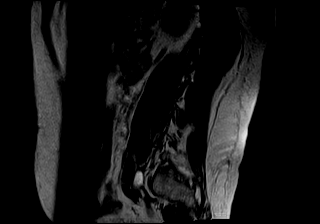
[im 3/14]
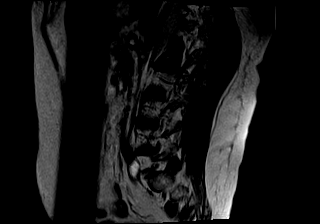
[im 5/14]
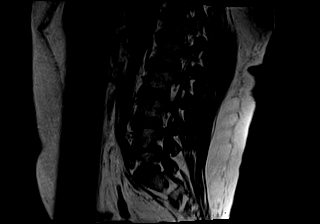
[im 7/14]
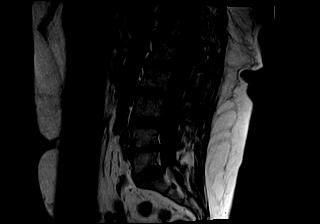
[im 9/14]
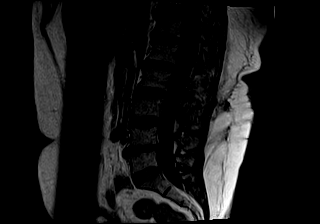
[im 11/14]
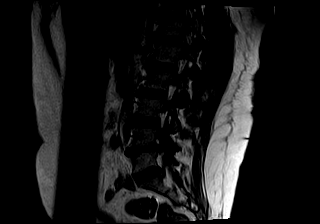
[im 14/14]
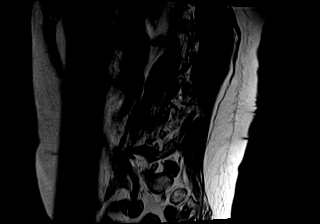

[Series 4: T2 · axial · 4.0mm · 0.55mm/px · z∈[-89,+113]mm · 11 of 20 slices shown (2 of 4)]
[im 1/20]
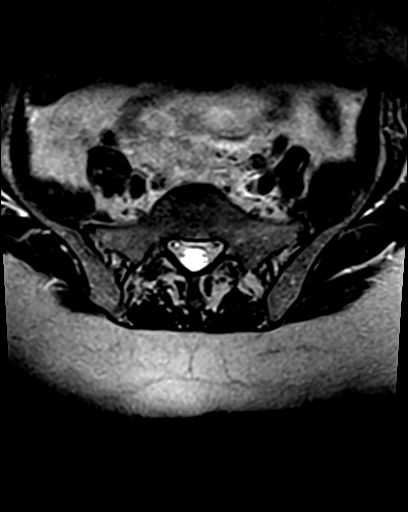
[im 2/20]
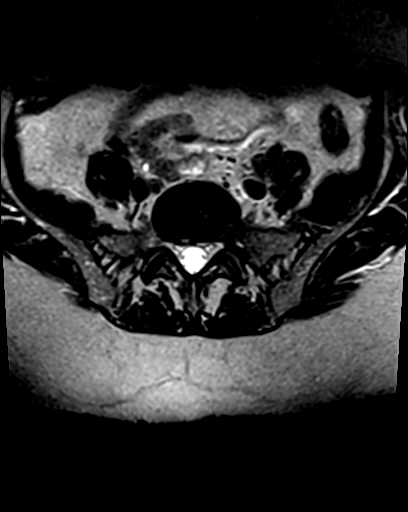
[im 4/20]
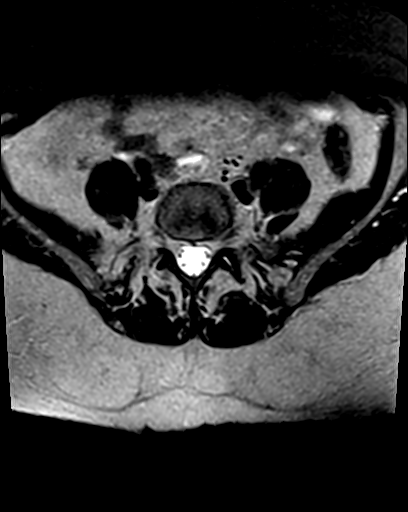
[im 6/20]
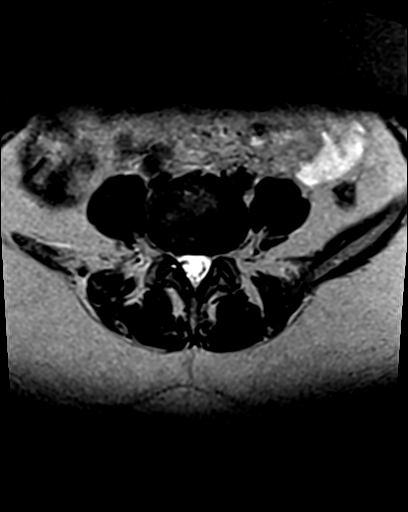
[im 8/20]
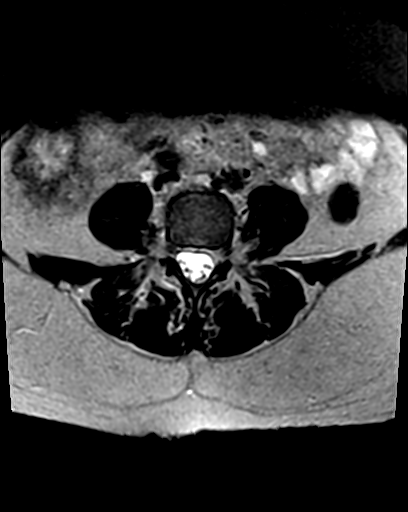
[im 10/20]
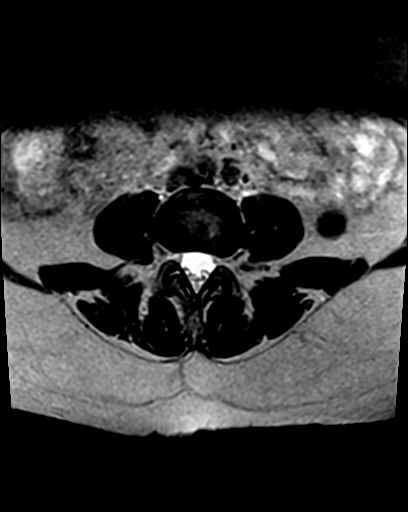
[im 12/20]
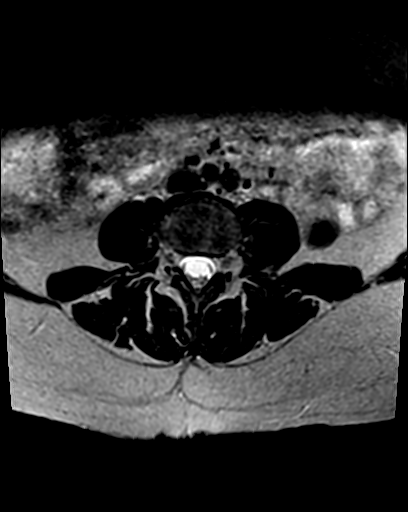
[im 14/20]
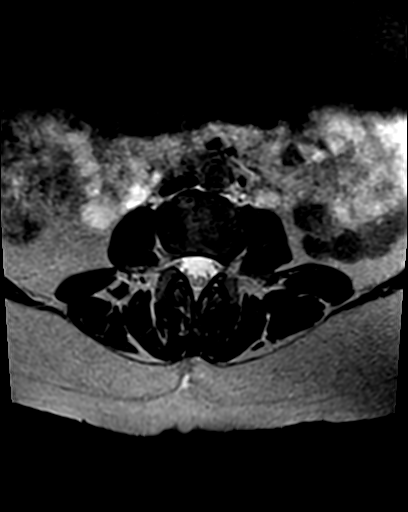
[im 16/20]
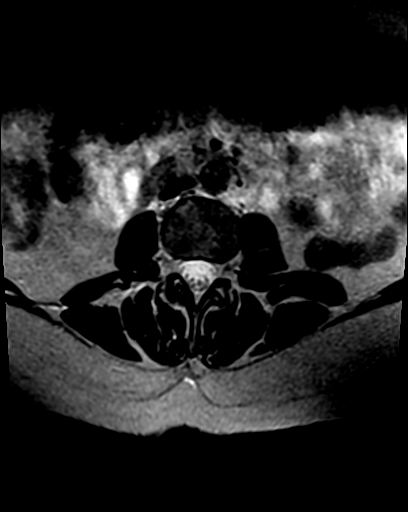
[im 18/20]
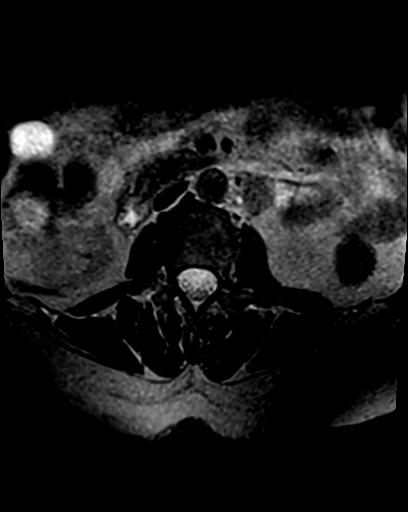
[im 20/20]
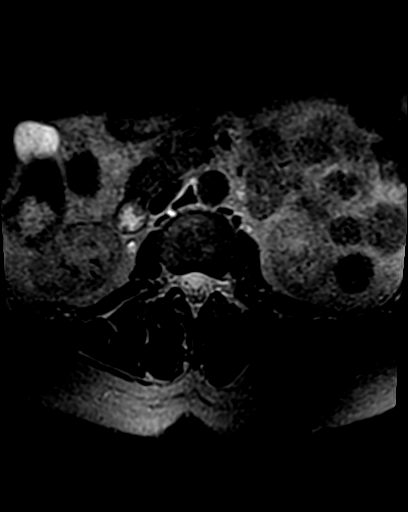

[Series 5: T2 · coronal · 4.0mm · 0.66mm/px · 8 of 16 slices shown (3 of 4)]
[im 1/16]
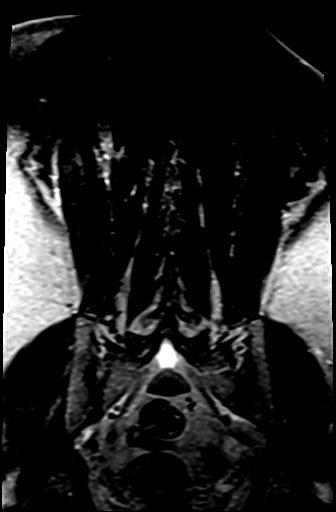
[im 3/16]
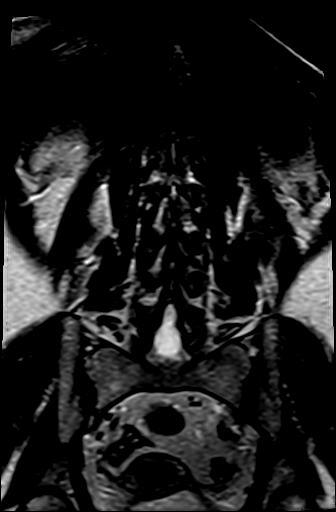
[im 5/16]
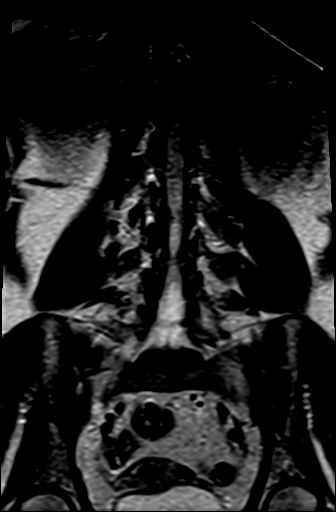
[im 7/16]
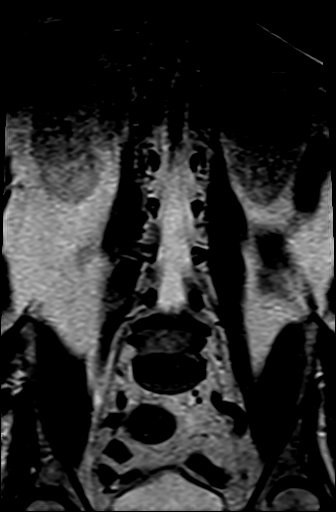
[im 9/16]
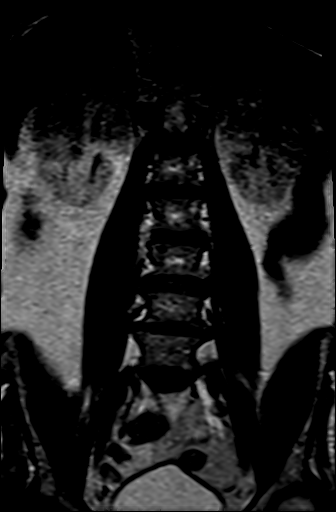
[im 11/16]
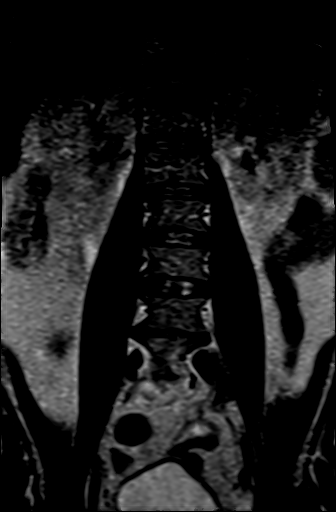
[im 13/16]
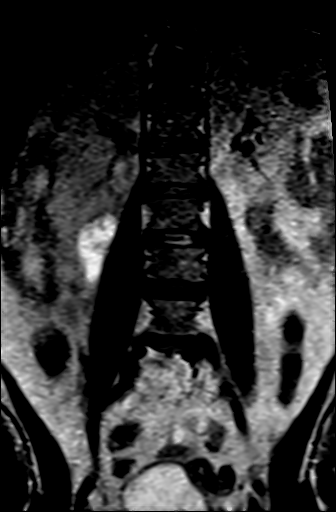
[im 16/16]
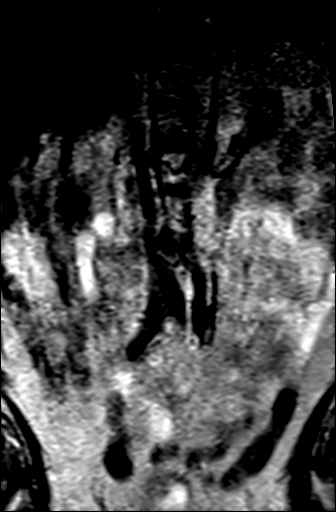

[Series 5001: T2 · axial · 10.0mm · 1.56mm/px · z∈[+0,+191]mm · 2 of 3 slices shown (4 of 4)]
[im 1/3]
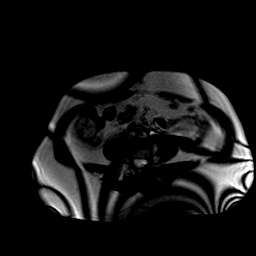
[im 3/3]
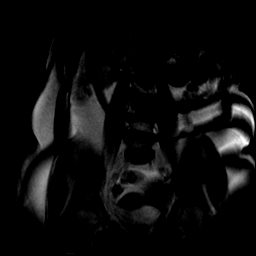

[35 of 48 positions shown; findings below may reference images not displayed]

RESSONÂNCIA MAGNÉTICA DA COLUNA LOMBOSSACRA

TÉCNICA:
Exame realizado em equipamento de ressonância magnética com sequências, ponderações e planos específicos para o segmento de interesse, sem a administração endovenosa do meio de contraste.

RESULTADO:
Bom alinhamento dos corpos vertebrais lombares.
Corpos vertebrais com alturas preservadas.
Osteofitose nos corpos vertebrais lombares.
Pedículos, lâminas, processos transversos e espinhosos preservados.
Articulações interapofisárias preservadas.
Sinais de desidratação discal em L4-L5 e L5-S1.
Redução da altura discal em L4-L5.

Nível L4-L5: Abaulamento discal simétrico , comprimindo a face ventral do saco dural e reduzindo as amplitudes foraminais, tocando as raízes emergentes de L4.
Nível L5-S1: Abaulamento discal simétrico , apresentando fissura do ânulo fibroso, retificando a face ventral do saco dural, sem conflitos radiculares.

Demais discos intervertebrais sem alterações significativas.
Canal vertebral e forames intervertebrais com amplitude preservada nos demais segmentos.
Espaço liquórico livre nos demais segmentos.
Raízes nervosas foraminais preservadas.
Cone medular com contornos regulares e sinal homogêneo.
Partes moles paravertebrais com aspecto preservado.

CONCLUSÃO:
Espondilose lombar.
Discopatia degenerativa acima descrita.

## 2023-05-01 IMAGING — MR MUSCULO cn^JOELHO
5 series · 40 of 40 positions shown · non-contrast
Comparison: none

[Series 2: axial stir_(person_name) · axial · 3.5mm · 0.70mm/px · z∈[-43,+47]mm · 9 of 20 slices shown]
[im 1/20]
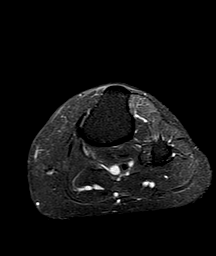
[im 3/20]
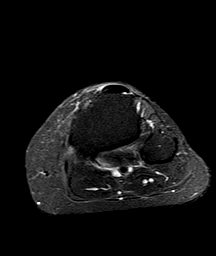
[im 5/20]
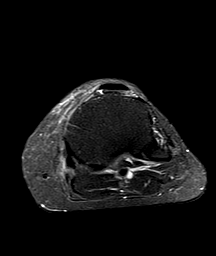
[im 8/20]
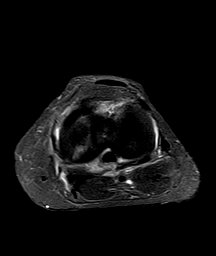
[im 10/20]
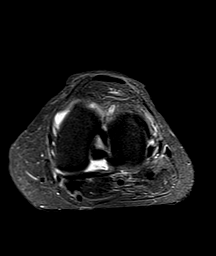
[im 12/20]
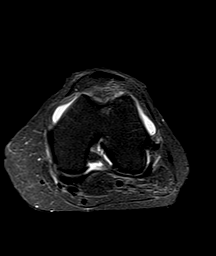
[im 15/20]
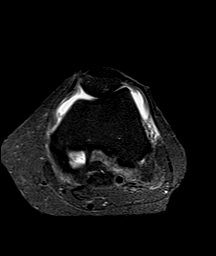
[im 17/20]
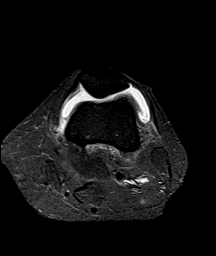
[im 20/20]
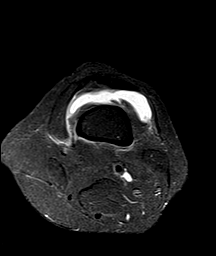

[Series 3: sag stir_(person_name) · sagittal · 3.5mm · 0.70mm/px · 9 of 20 slices shown]
[im 1/20]
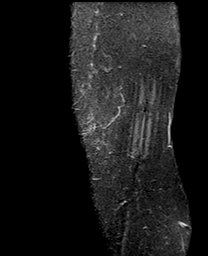
[im 3/20]
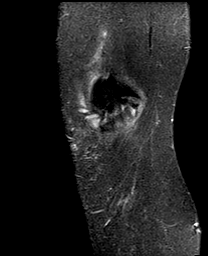
[im 5/20]
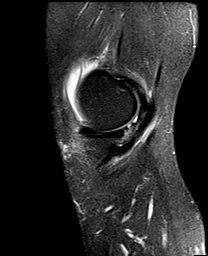
[im 8/20]
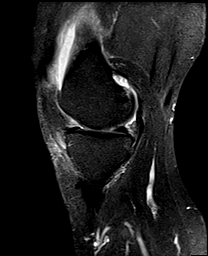
[im 10/20]
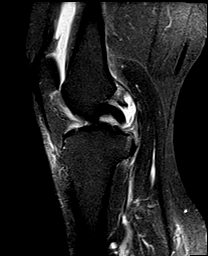
[im 12/20]
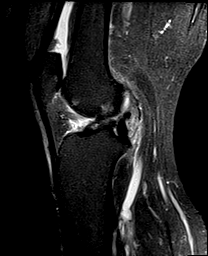
[im 15/20]
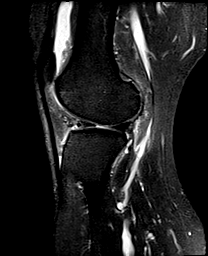
[im 17/20]
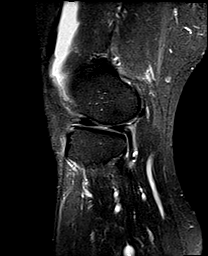
[im 20/20]
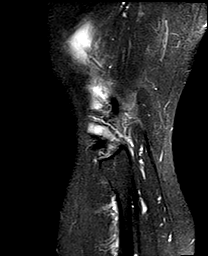

[Series 4: sag t1_(person_name) · sagittal · 3.5mm · 0.70mm/px · 9 of 20 slices shown]
[im 1/20]
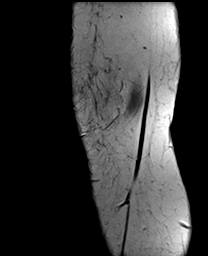
[im 3/20]
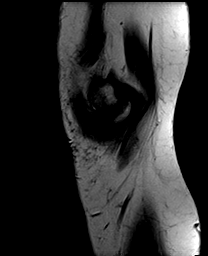
[im 5/20]
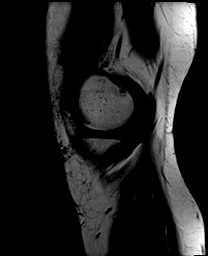
[im 8/20]
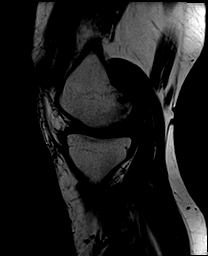
[im 10/20]
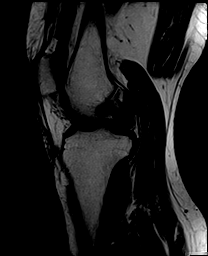
[im 12/20]
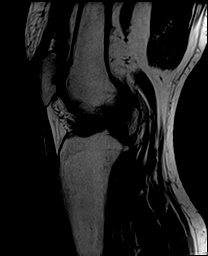
[im 15/20]
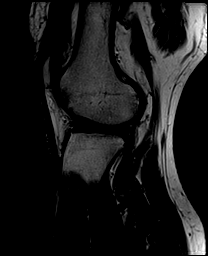
[im 17/20]
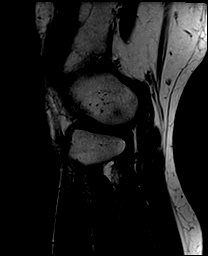
[im 20/20]
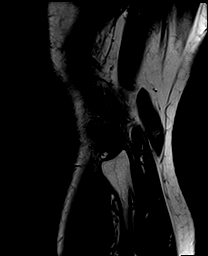

[Series 5: cor stir_(person_name) · coronal · 3.0mm · 0.70mm/px · 9 of 20 slices shown]
[im 1/20]
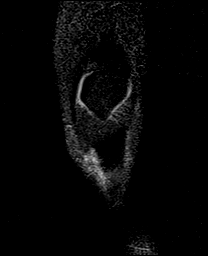
[im 3/20]
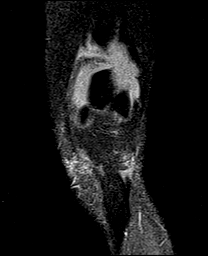
[im 5/20]
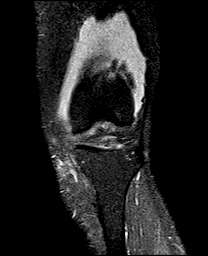
[im 8/20]
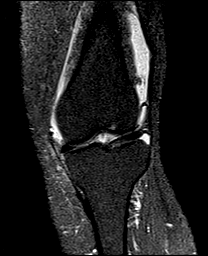
[im 10/20]
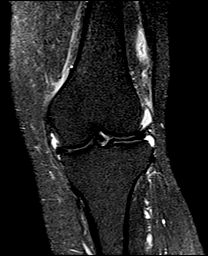
[im 12/20]
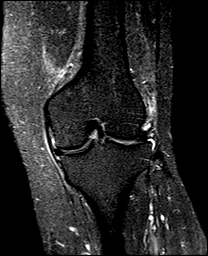
[im 15/20]
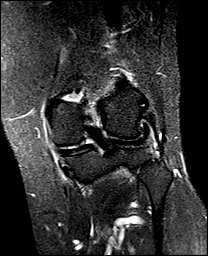
[im 17/20]
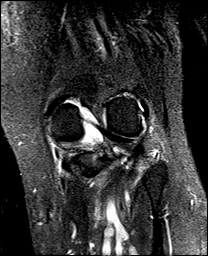
[im 20/20]
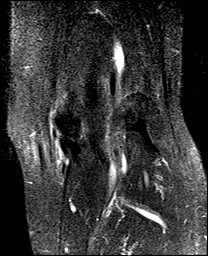

[Series 6: cor_t2_tse_2mm_obl · oblique · 2.2mm · 0.29mm/px · 4 of 9 slices shown]
[im 1/9]
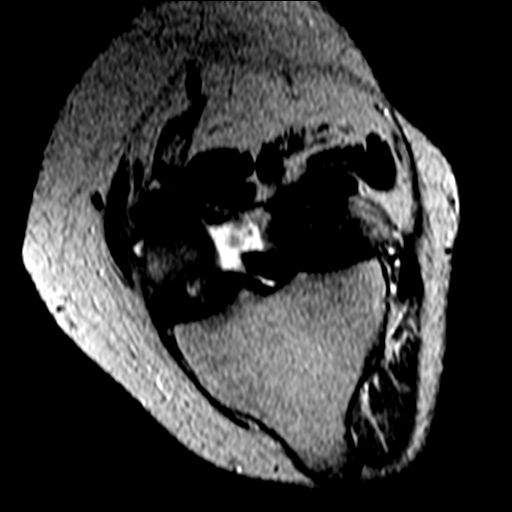
[im 3/9]
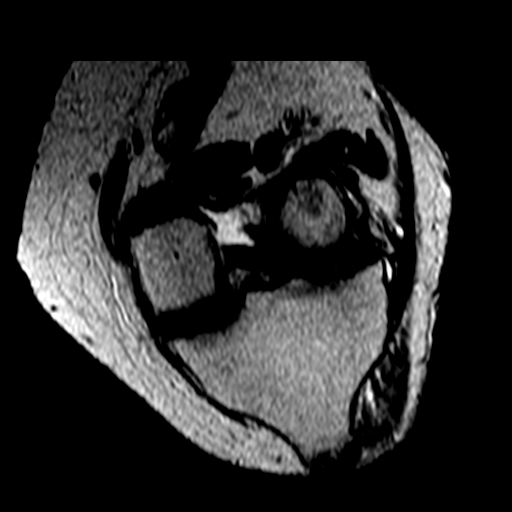
[im 6/9]
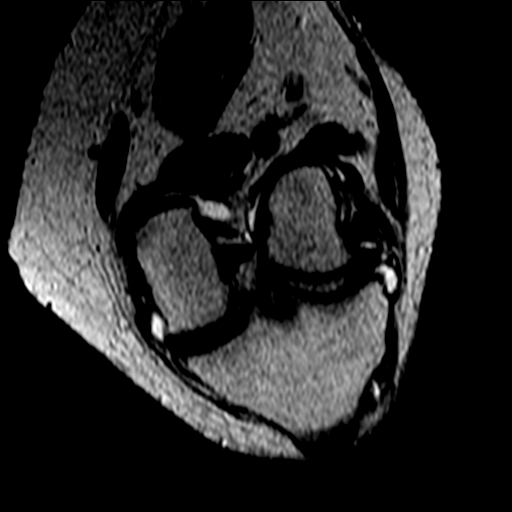
[im 9/9]
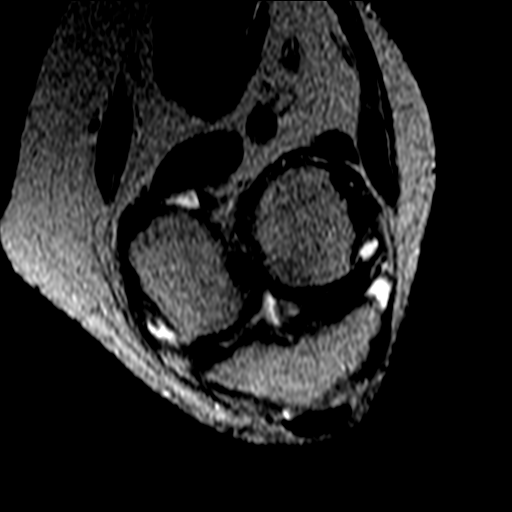

[40 of 40 positions shown; findings below may reference images not displayed]

RESSONÂNCIA MAGNÉTICA DO JOELHO ESQUERDO

TÉCNICA:

Exame realizado em equipamento de ressonância magnética com sequências, ponderações e planos específicos para o segmento de interesse, sem a administração endovenosa do meio de contraste.

RESULTADO:

Patela normoposicionada com o joelho em extensão.
Estruturas ósseas com morfologia e sinal preservados.
Erosão condral profunda na área de carga do côndilo femoral medial, medindo cerca de 1,0 x 0,6 cm.
Restante do revestimentos condrais com espessura mantida e sem anormalidades.
Alteração do sinal do menisco medial de aspecto degenerativo.
Menisco lateral com morfologia e sinal normais.
Ligamento cruzado anterior íntegro.
Ligamento cruzado posterior normal.
Ligamento colateral medial sem alterações.
Ligamento colateral lateral normal.
Tendão quadríceps preservado.
Tendão patelar sem alterações.
Tendões da pata anserina normais.
Banda iliotibial sem alterações.
Tendão do bíceps femoral normal.
Derrame articular. 
Cisto de Baker laminar.
Demais planos musculares e tendíneos preservados.

CONCLUSÃO:
Erosão condral profunda na área de carga do côndilo femoral medial.
Alteração do sinal do menisco medial de aspecto degenerativo.
Derrame articular. 
Cisto de Baker laminar.

## 2023-05-01 IMAGING — MR MUSCULO cn^OMBRO
6 series · 39 of 40 positions shown · non-contrast
Comparison: none

[Series 4: T2 · axial · 4.0mm · 0.43mm/px · z∈[-62,+25]mm · 8 of 20 slices shown (1 of 3)]
[im 1/20]
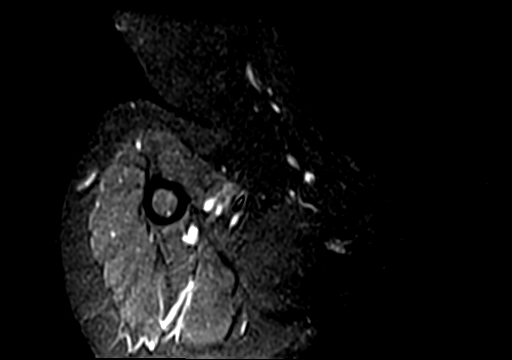
[im 3/20]
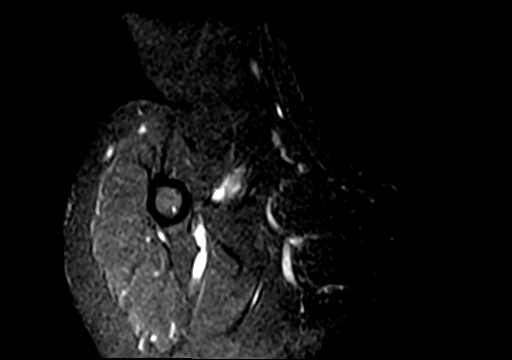
[im 6/20]
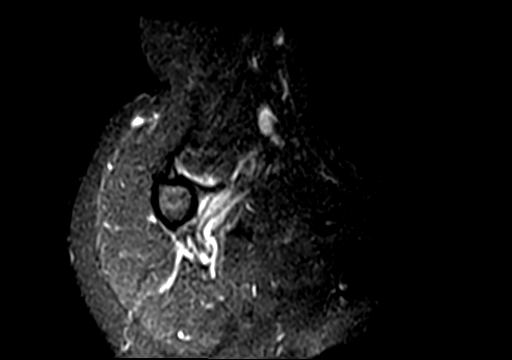
[im 9/20]
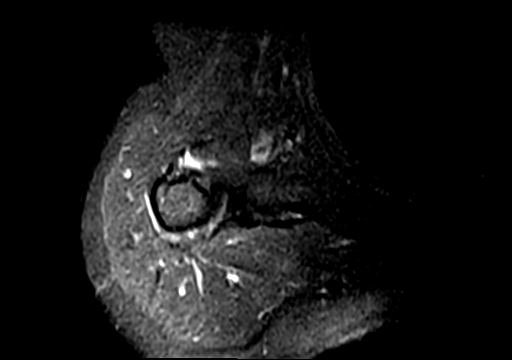
[im 11/20]
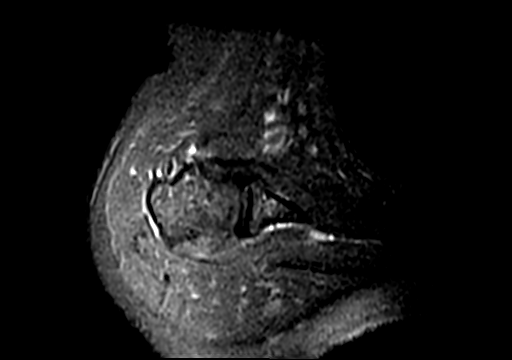
[im 14/20]
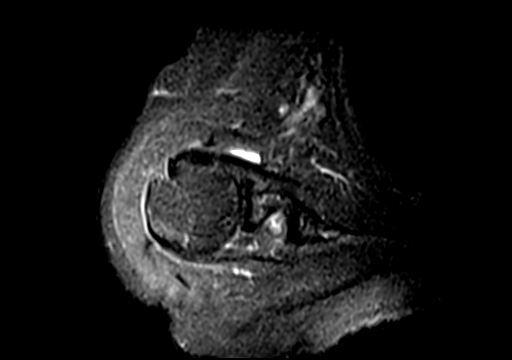
[im 17/20]
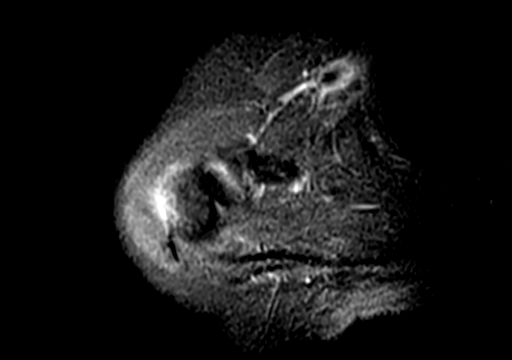
[im 20/20]
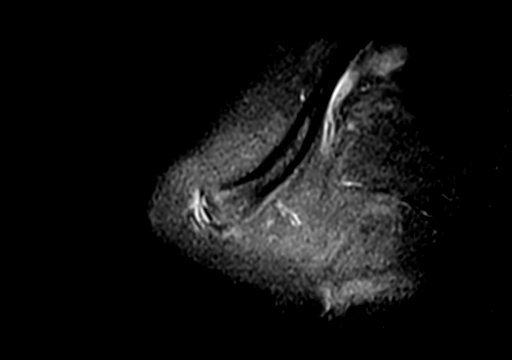

[Series 5: axi dp_(person_name) · axial · 4.0mm · 0.69mm/px · z∈[-62,+25]mm · 8 of 20 slices shown]
[im 1/20]
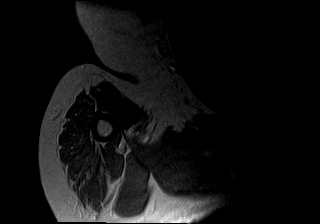
[im 3/20]
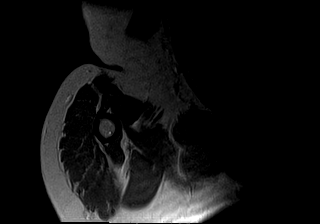
[im 6/20]
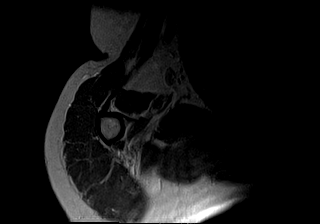
[im 9/20]
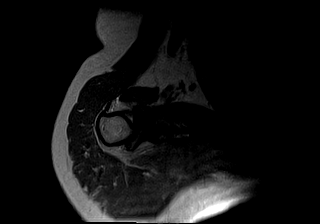
[im 11/20]
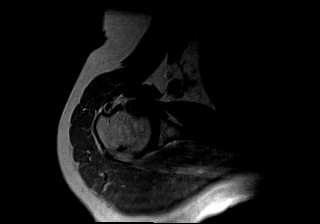
[im 14/20]
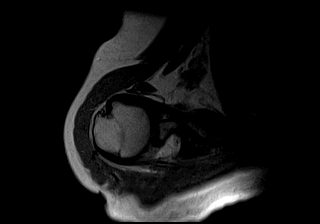
[im 17/20]
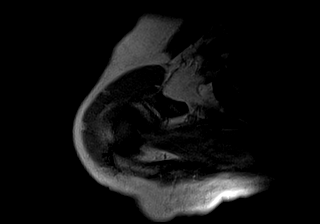
[im 20/20]
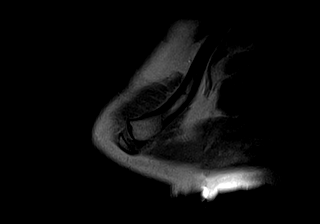

[Series 6: T2 · oblique · 4.0mm · 0.43mm/px · 6 of 15 slices shown (2 of 3)]
[im 1/15]
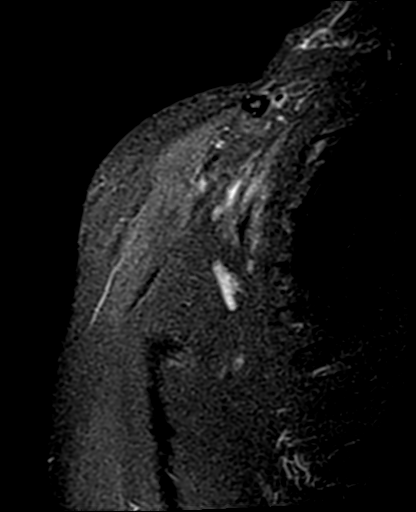
[im 3/15]
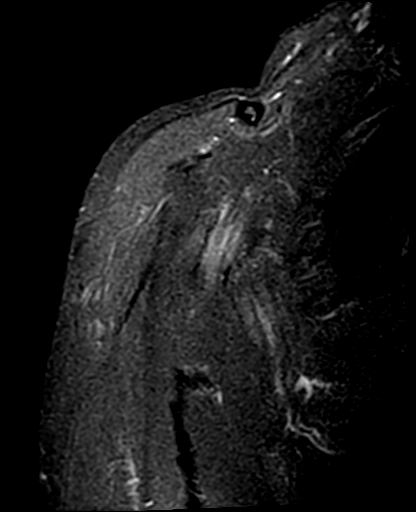
[im 6/15]
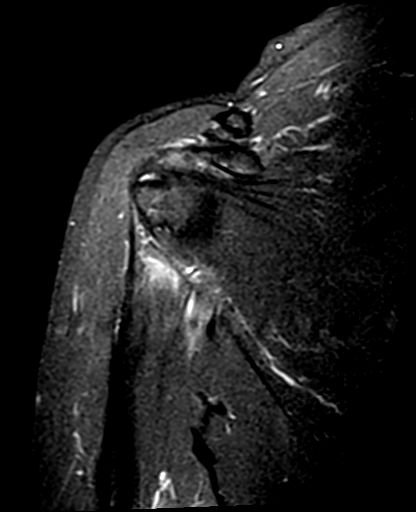
[im 9/15]
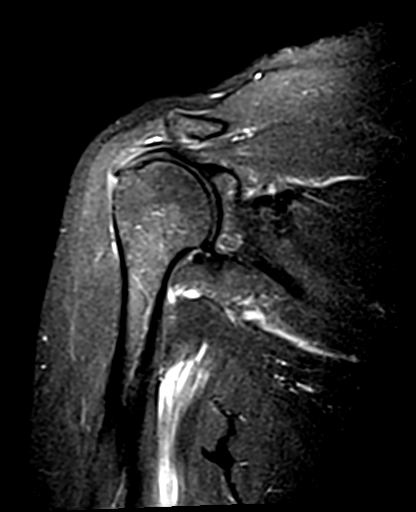
[im 12/15]
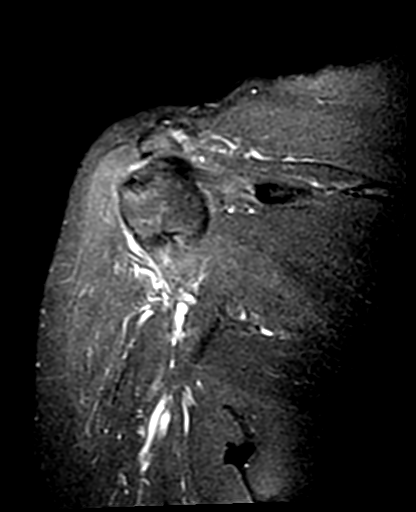
[im 15/15]
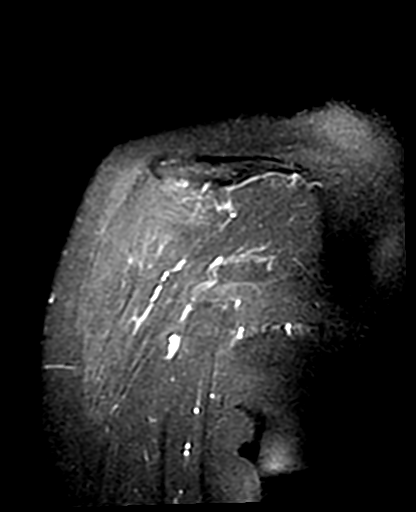

[Series 7: cor dp_(person_name) · oblique · 4.0mm · 0.69mm/px · 6 of 15 slices shown]
[im 1/15]
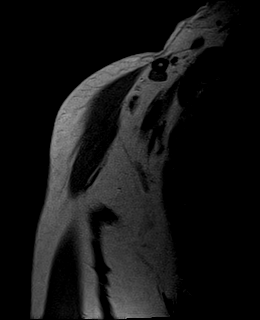
[im 3/15]
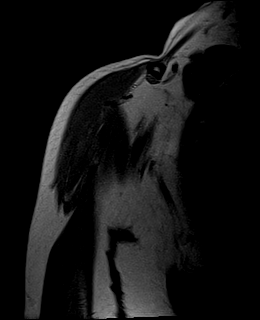
[im 6/15]
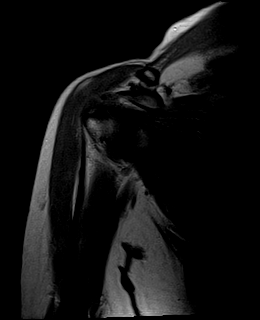
[im 9/15]
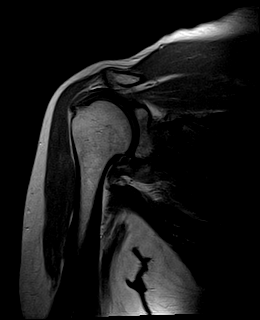
[im 12/15]
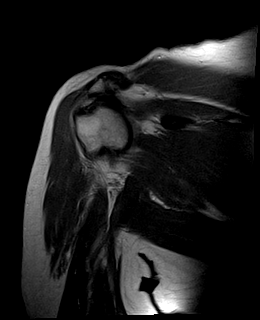
[im 15/15]
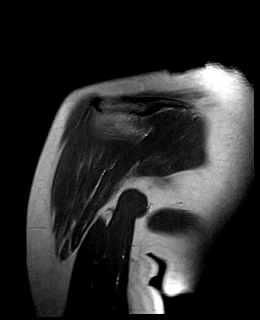

[Series 8: sag t1_(person_name) · oblique · 4.0mm · 0.75mm/px · 6 of 15 slices shown]
[im 1/15]
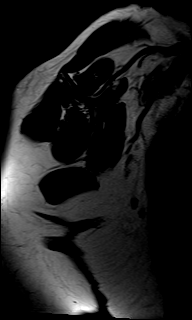
[im 3/15]
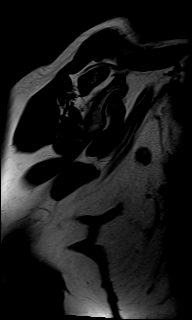
[im 6/15]
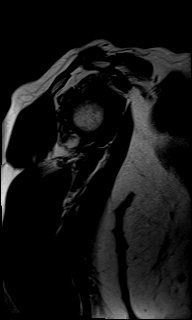
[im 9/15]
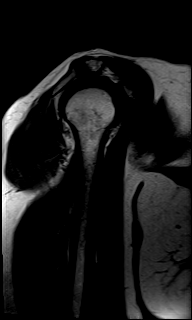
[im 12/15]
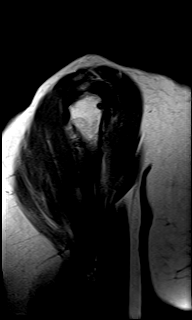
[im 15/15]
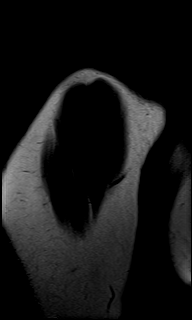

[Series 9: T2 · oblique · 4.0mm · 0.47mm/px · 5 of 15 slices shown (3 of 3)]
[im 1/15]
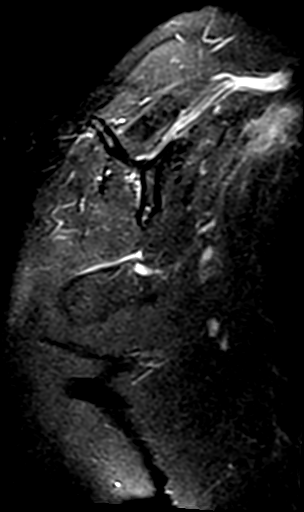
[im 3/15]
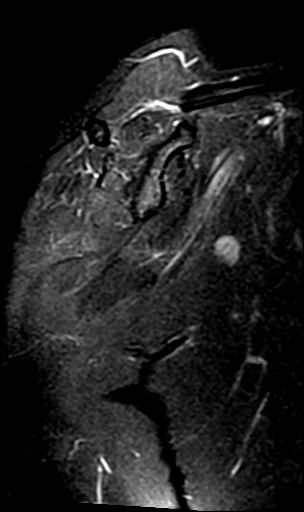
[im 6/15]
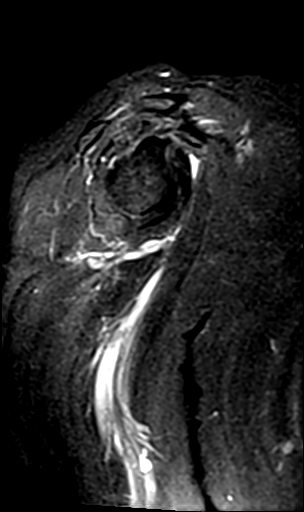
[im 9/15]
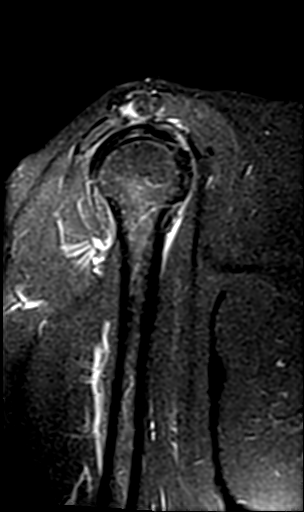
[im 12/15]
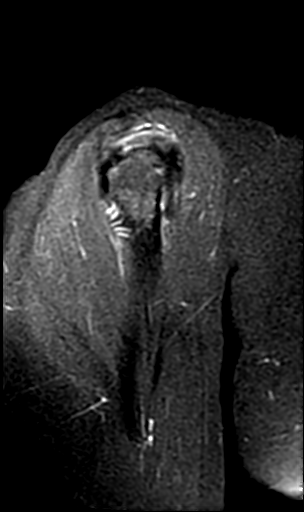

[39 of 40 positions shown; findings below may reference images not displayed]

RESSONÂNCIA MAGNÉTICA DO OMBRO DIREITO

TÉCNICA:
Exame realizado em equipamento de ressonância magnética com sequências, ponderações e planos específicos para o segmento de interesse, sem a administração endovenosa do meio de contraste.

RESULTADO:
Alterações degenerativas da articulação acromioclavicular, caracterizadas por espessamento capsuloligamentar e irregularidades da superfície condral. 
Acrômio curvo.
Demais estruturas ósseas sem anormalidades significativas.
Cartilagens de revestimento preservadas.
Não há evidências de derrame articular ou sinovite.
Tendão subescapular normal.
Tendinopatia com rotura de espessura parcial (menos de 50%) das fibras bursais de transição do supraespinhal e do infraespinhal medindo cerca de 0,5 x 0,9 cm.
Tendão redondo menor preservado.
Tendão do cabo longo do bíceps sem alterações.
Ventres musculares sem alterações.
Bursite subacromial/subdeltoidea.
Lábio da glenóide com morfologia e sinal dentro da normalidade.
Demais estruturas periarticulares sem anormalidades.

CONCLUSÃO:
Alterações degenerativas da articulação acromioclavicular.
Tendinopatia com rotura de espessura parcial (menos de 50%) das fibras bursais de transição do supraespinhal e do infraespinhal medindo cerca de 0,5 x 0,9 cm.
Bursite subacromial/subdeltoidea.
